# Patient Record
Sex: Female | Born: 1985 | Race: Black or African American | Hispanic: No | Marital: Married | Smoking: Never smoker
Health system: Southern US, Community
[De-identification: ages and names within clinical notes are randomized; demographics above are authoritative.]

## PROBLEM LIST (undated history)

## (undated) DIAGNOSIS — I10 Essential (primary) hypertension: Secondary | ICD-10-CM

## (undated) DIAGNOSIS — N938 Other specified abnormal uterine and vaginal bleeding: Principal | ICD-10-CM

## (undated) DIAGNOSIS — E282 Polycystic ovarian syndrome: Secondary | ICD-10-CM

## (undated) DIAGNOSIS — A6000 Herpesviral infection of urogenital system, unspecified: Secondary | ICD-10-CM

## (undated) DIAGNOSIS — A549 Gonococcal infection, unspecified: Secondary | ICD-10-CM

## (undated) HISTORY — DX: Morbid (severe) obesity due to excess calories: E66.01

## (undated) HISTORY — DX: Other specified abnormal uterine and vaginal bleeding: N93.8

---

## 2009-10-10 DIAGNOSIS — N938 Other specified abnormal uterine and vaginal bleeding: Secondary | ICD-10-CM

## 2009-10-10 DIAGNOSIS — E282 Polycystic ovarian syndrome: Secondary | ICD-10-CM

## 2009-10-10 HISTORY — DX: Polycystic ovarian syndrome: E28.2

## 2009-10-10 HISTORY — DX: Other specified abnormal uterine and vaginal bleeding: N93.8

## 2009-12-15 ENCOUNTER — Emergency Department (HOSPITAL_COMMUNITY): Admission: EM | Admit: 2009-12-15 | Discharge: 2009-12-15 | Payer: Self-pay | Admitting: Emergency Medicine

## 2010-03-09 ENCOUNTER — Emergency Department (HOSPITAL_COMMUNITY): Admission: EM | Admit: 2010-03-09 | Discharge: 2010-03-09 | Payer: Self-pay | Admitting: Emergency Medicine

## 2010-06-02 ENCOUNTER — Emergency Department (HOSPITAL_COMMUNITY): Admission: EM | Admit: 2010-06-02 | Discharge: 2010-06-02 | Payer: Self-pay | Admitting: Emergency Medicine

## 2010-08-21 ENCOUNTER — Emergency Department (HOSPITAL_COMMUNITY): Admission: EM | Admit: 2010-08-21 | Discharge: 2010-08-22 | Payer: Self-pay | Admitting: Emergency Medicine

## 2010-09-14 ENCOUNTER — Emergency Department (HOSPITAL_COMMUNITY)
Admission: EM | Admit: 2010-09-14 | Discharge: 2010-09-14 | Payer: Self-pay | Source: Home / Self Care | Admitting: Emergency Medicine

## 2010-09-27 ENCOUNTER — Emergency Department (HOSPITAL_COMMUNITY)
Admission: EM | Admit: 2010-09-27 | Discharge: 2010-09-27 | Payer: Self-pay | Source: Home / Self Care | Admitting: Emergency Medicine

## 2010-12-01 ENCOUNTER — Ambulatory Visit: Payer: Self-pay | Admitting: Family Medicine

## 2010-12-12 ENCOUNTER — Emergency Department (HOSPITAL_COMMUNITY)
Admission: EM | Admit: 2010-12-12 | Discharge: 2010-12-12 | Disposition: A | Discharge status: Home / Self Care | Payer: Self-pay | Attending: Emergency Medicine | Admitting: Emergency Medicine

## 2010-12-12 DIAGNOSIS — S335XXA Sprain of ligaments of lumbar spine, initial encounter: Secondary | ICD-10-CM | POA: Insufficient documentation

## 2010-12-12 DIAGNOSIS — R51 Headache: Secondary | ICD-10-CM | POA: Insufficient documentation

## 2010-12-12 DIAGNOSIS — M545 Low back pain, unspecified: Secondary | ICD-10-CM | POA: Insufficient documentation

## 2010-12-12 DIAGNOSIS — X58XXXA Exposure to other specified factors, initial encounter: Secondary | ICD-10-CM | POA: Insufficient documentation

## 2010-12-12 DIAGNOSIS — J45909 Unspecified asthma, uncomplicated: Secondary | ICD-10-CM | POA: Insufficient documentation

## 2010-12-21 LAB — CBC
HCT: 34.9 % — ABNORMAL LOW (ref 36.0–46.0)
Hemoglobin: 10.8 g/dL — ABNORMAL LOW (ref 12.0–15.0)
MCH: 22.2 pg — ABNORMAL LOW (ref 26.0–34.0)
MCH: 22 pg — ABNORMAL LOW (ref 26.0–34.0)
MCHC: 31.1 g/dL (ref 30.0–36.0)
Platelets: 337 10*3/uL (ref 150–400)
RBC: 4.91 MIL/uL (ref 3.87–5.11)
RDW: 16.9 % — ABNORMAL HIGH (ref 11.5–15.5)

## 2010-12-21 LAB — GC/CHLAMYDIA PROBE AMP, GENITAL
Chlamydia, DNA Probe: NEGATIVE
Chlamydia, DNA Probe: NEGATIVE
GC Probe Amp, Genital: NEGATIVE

## 2010-12-21 LAB — WET PREP, GENITAL
Clue Cells Wet Prep HPF POC: NONE SEEN
Trich, Wet Prep: NONE SEEN
Trich, Wet Prep: NONE SEEN
WBC, Wet Prep HPF POC: NONE SEEN

## 2010-12-21 LAB — HEPATIC FUNCTION PANEL
ALT: 17 U/L (ref 0–35)
AST: 21 U/L (ref 0–37)
Albumin: 3.9 g/dL (ref 3.5–5.2)
Bilirubin, Direct: 0.2 mg/dL (ref 0.0–0.3)
Total Bilirubin: 0.9 mg/dL (ref 0.3–1.2)

## 2010-12-21 LAB — POCT I-STAT, CHEM 8
BUN: 12 mg/dL (ref 6–23)
Calcium, Ion: 1.15 mmol/L (ref 1.12–1.32)
Chloride: 107 meq/L (ref 96–112)
Creatinine, Ser: 0.8 mg/dL (ref 0.4–1.2)
Glucose, Bld: 83 mg/dL (ref 70–99)
HCT: 37 % (ref 36.0–46.0)
Hemoglobin: 12.6 g/dL (ref 12.0–15.0)
Potassium: 4.2 meq/L (ref 3.5–5.1)
Sodium: 142 meq/L (ref 135–145)
TCO2: 26 mmol/L (ref 0–100)

## 2010-12-21 LAB — URINALYSIS, ROUTINE W REFLEX MICROSCOPIC
Glucose, UA: NEGATIVE mg/dL
Hgb urine dipstick: NEGATIVE
Hgb urine dipstick: NEGATIVE
Ketones, ur: 15 mg/dL — AB
Nitrite: NEGATIVE
Specific Gravity, Urine: 1.027 (ref 1.005–1.030)
Urobilinogen, UA: 1 mg/dL (ref 0.0–1.0)
pH: 6 (ref 5.0–8.0)

## 2010-12-21 LAB — DIFFERENTIAL
Basophils Relative: 0 % (ref 0–1)
Eosinophils Relative: 1 % (ref 0–5)
Lymphocytes Relative: 45 % (ref 12–46)
Neutrophils Relative %: 46 % (ref 43–77)

## 2010-12-21 LAB — BASIC METABOLIC PANEL
CO2: 26 mEq/L (ref 19–32)
Calcium: 8.9 mg/dL (ref 8.4–10.5)
GFR calc Af Amer: >60 mL/min (ref >60)
Potassium: 3.8 mEq/L (ref 3.5–5.1)
Sodium: 139 mEq/L (ref 135–145)

## 2010-12-21 LAB — POCT PREGNANCY, URINE: Preg Test, Ur: NEGATIVE

## 2010-12-24 LAB — RAPID STREP SCREEN (MED CTR MEBANE ONLY): Streptococcus, Group A Screen (Direct): NEGATIVE

## 2010-12-27 LAB — POCT PREGNANCY, URINE: Preg Test, Ur: NEGATIVE

## 2011-01-03 LAB — DIFFERENTIAL
Basophils Absolute: 0.1 10*3/uL (ref 0.0–0.1)
Basophils Relative: 2 % — ABNORMAL HIGH (ref 0–1)
Lymphocytes Relative: 30 % (ref 12–46)
Monocytes Absolute: 0.5 10*3/uL (ref 0.1–1.0)
Neutro Abs: 4.3 10*3/uL (ref 1.7–7.7)
Neutrophils Relative %: 60 % (ref 43–77)

## 2011-01-03 LAB — WET PREP, GENITAL
Trich, Wet Prep: NONE SEEN
Yeast Wet Prep HPF POC: NONE SEEN

## 2011-01-03 LAB — POCT I-STAT, CHEM 8
BUN: 13 mg/dL (ref 6–23)
Calcium, Ion: 1.16 mmol/L (ref 1.12–1.32)
Hemoglobin: 10.5 g/dL — ABNORMAL LOW (ref 12.0–15.0)
Sodium: 141 mEq/L (ref 135–145)
TCO2: 27 mmol/L (ref 0–100)

## 2011-01-03 LAB — URINALYSIS, ROUTINE W REFLEX MICROSCOPIC
Bilirubin Urine: NEGATIVE
Nitrite: NEGATIVE
Protein, ur: NEGATIVE mg/dL
Specific Gravity, Urine: 1.008 (ref 1.005–1.030)
Urobilinogen, UA: 0.2 mg/dL (ref 0.0–1.0)

## 2011-01-03 LAB — URINE MICROSCOPIC-ADD ON

## 2011-01-03 LAB — CBC
Hemoglobin: 9.8 g/dL — ABNORMAL LOW (ref 12.0–15.0)
MCHC: 33 g/dL (ref 30.0–36.0)
Platelets: 315 10*3/uL (ref 150–400)
RDW: 16.5 % — ABNORMAL HIGH (ref 11.5–15.5)

## 2011-01-03 LAB — POCT PREGNANCY, URINE: Preg Test, Ur: NEGATIVE

## 2011-01-03 LAB — GC/CHLAMYDIA PROBE AMP, GENITAL
Chlamydia, DNA Probe: NEGATIVE
GC Probe Amp, Genital: NEGATIVE

## 2011-01-03 LAB — RPR: RPR Ser Ql: NONREACTIVE

## 2011-01-19 ENCOUNTER — Emergency Department (HOSPITAL_COMMUNITY): Payer: Self-pay

## 2011-01-19 ENCOUNTER — Emergency Department (HOSPITAL_COMMUNITY)
Admission: EM | Admit: 2011-01-19 | Discharge: 2011-01-19 | Disposition: A | Discharge status: Home / Self Care | Payer: Self-pay | Attending: Emergency Medicine | Admitting: Emergency Medicine

## 2011-01-19 DIAGNOSIS — N898 Other specified noninflammatory disorders of vagina: Secondary | ICD-10-CM | POA: Insufficient documentation

## 2011-01-19 DIAGNOSIS — N83209 Unspecified ovarian cyst, unspecified side: Secondary | ICD-10-CM | POA: Insufficient documentation

## 2011-01-19 DIAGNOSIS — R10813 Right lower quadrant abdominal tenderness: Secondary | ICD-10-CM | POA: Insufficient documentation

## 2011-01-19 DIAGNOSIS — R109 Unspecified abdominal pain: Secondary | ICD-10-CM | POA: Insufficient documentation

## 2011-01-19 LAB — BASIC METABOLIC PANEL
BUN: 14 mg/dL (ref 6–23)
Calcium: 8.8 mg/dL (ref 8.4–10.5)
GFR calc non Af Amer: >60 mL/min (ref >60)
Glucose, Bld: 91 mg/dL (ref 70–99)

## 2011-01-19 LAB — URINALYSIS, ROUTINE W REFLEX MICROSCOPIC
Bilirubin Urine: NEGATIVE
Glucose, UA: NEGATIVE mg/dL
Specific Gravity, Urine: 1.018 (ref 1.005–1.030)
pH: 6 (ref 5.0–8.0)

## 2011-01-19 LAB — CBC
MCH: 21.9 pg — ABNORMAL LOW (ref 26.0–34.0)
MCV: 69.3 fL — ABNORMAL LOW (ref 78.0–100.0)
Platelets: 301 10*3/uL (ref 150–400)
RDW: 16.8 % — ABNORMAL HIGH (ref 11.5–15.5)
WBC: 4.9 10*3/uL (ref 4.0–10.5)

## 2011-01-19 LAB — DIFFERENTIAL
Basophils Absolute: 0 10*3/uL (ref 0.0–0.1)
Basophils Relative: 0 % (ref 0–1)
Eosinophils Absolute: 0.1 10*3/uL (ref 0.0–0.7)
Lymphocytes Relative: 40 % (ref 12–46)
Monocytes Absolute: 0.6 10*3/uL (ref 0.1–1.0)
Neutrophils Relative %: 44 % (ref 43–77)

## 2011-01-19 LAB — POCT PREGNANCY, URINE: Preg Test, Ur: NEGATIVE

## 2011-01-19 LAB — URINE MICROSCOPIC-ADD ON

## 2011-01-19 LAB — WET PREP, GENITAL: Yeast Wet Prep HPF POC: NONE SEEN

## 2011-01-19 MED ORDER — IOHEXOL 300 MG/ML  SOLN
100.0000 mL | Freq: Once | INTRAMUSCULAR | Status: AC | PRN
  Administered 2011-01-19: 100 mL via INTRAVENOUS

## 2011-01-20 ENCOUNTER — Ambulatory Visit (INDEPENDENT_AMBULATORY_CARE_PROVIDER_SITE_OTHER): Payer: Self-pay | Admitting: Family

## 2011-01-20 ENCOUNTER — Other Ambulatory Visit: Payer: Self-pay | Admitting: Family

## 2011-01-20 DIAGNOSIS — E282 Polycystic ovarian syndrome: Secondary | ICD-10-CM

## 2011-01-20 LAB — GC/CHLAMYDIA PROBE AMP, GENITAL
Chlamydia, DNA Probe: NEGATIVE
GC Probe Amp, Genital: NEGATIVE

## 2011-01-21 NOTE — Assessment & Plan Note (Signed)
[name], [name]            ACCOUNT NO.:  [id number]  MEDICAL RECORD NO.:  [id number]           PATIENT TYPE:  A  LOCATION:  [facility] at [facility]         FACILITY:  [facility]  PHYSICIAN:  Sid Falcon, CNM  DATE OF BIRTH:  04/10/1986  DATE OF SERVICE:  01/20/2011                                 CLINIC NOTE  REASON FOR VISIT:  Evaluation for irregular menstrual cycle.  HISTORY OF PRESENT ILLNESS:  The patient is here with report of having her cycles every 2-3 months, and recently seen at [facility] with a completed CT scan of the abdomen and pelvis due to abdominal pain.  The results show small ovarian follicles noticed bilaterally with moderate amount of free fluid in the pelvis.  All other things that are in the ultrasound was normal.  MENSTRUAL CYCLE HISTORY:  Last menstrual period, September 16, 2010, lasted approximately 7 days.  CONTRACEPTIVE HISTORY:  Has never used any form of birth control.  OBSTETRICAL HISTORY:  Last pregnancy was in 2010 with a miscarriage at 8 weeks.  Last Pap smear, 2010.  No history of abnormal Pap.  MEDICAL HISTORY:  Asthma and high blood pressure.  SOCIAL HISTORY:  Lives with boyfriend.  No history of smoking, alcohol, illicit drug use.  No history of sexual or physical abuse.  FAMILY HISTORY:  Father with diabetes and high blood pressure.  Cervical cancer in her mother.  REVIEW OF SYSTEMS:  The patient reports having intermittent weakness with occasional headaches, occurring 2 weeks ago.  Has some nausea and pain that was noted on April 5 which she was seen at [facility].  PHYSICAL EXAMINATION:  VITAL SIGNS:  The patient's temperature is 98.3, pulse 74, blood pressure 139/93, weight 331 pounds, height 5 feet 8 inches. GENERAL:  The patient is alert and oriented x3.  Torso, no linear hair noted. NECK:  No thyromegaly.  No dominant masses, nontender with palpation. Darkening at the nape of the neck. CHEST:  Clear to  auscultation bilaterally. CARDIOVASCULAR SYSTEM:  Regular rate and rhythm without murmurs, gallops, or rubs. ABDOMEN:  Morbidly obese. PELVIC:  No abnormal lesions noted.  Cervix found without difficulty. Pap smear obtained.  Negative cervical motion tenderness.  ASSESSMENT: 1. Polycystic ovarian syndrome. 2. Hypertension.  PLAN:  The patient is to follow up with her PCP for management of her Hypertension.  Labs:  hemoglobin A1c, total testosterone, FSH, LH, TSH, prolactin.  Completed pregnancy test last night, negative.  Prescription given forProvera.  The patient is to follow up in 2 weeks for results.     Sid Falcon, CNM    WM/MEDQ  D:  01/20/2011  T:  01/21/2011  Job:  [id number]

## 2011-01-24 ENCOUNTER — Emergency Department (HOSPITAL_COMMUNITY): Payer: Self-pay

## 2011-01-24 ENCOUNTER — Emergency Department (HOSPITAL_COMMUNITY)
Admission: EM | Admit: 2011-01-24 | Discharge: 2011-01-24 | Disposition: A | Discharge status: Home / Self Care | Payer: Self-pay | Attending: Emergency Medicine | Admitting: Emergency Medicine

## 2011-01-24 DIAGNOSIS — J45909 Unspecified asthma, uncomplicated: Secondary | ICD-10-CM | POA: Insufficient documentation

## 2011-01-24 DIAGNOSIS — R109 Unspecified abdominal pain: Secondary | ICD-10-CM | POA: Insufficient documentation

## 2011-01-24 DIAGNOSIS — R51 Headache: Secondary | ICD-10-CM | POA: Insufficient documentation

## 2011-01-26 ENCOUNTER — Ambulatory Visit: Payer: Self-pay | Admitting: Family Medicine

## 2011-02-03 ENCOUNTER — Ambulatory Visit: Payer: Self-pay | Admitting: Family

## 2011-07-03 ENCOUNTER — Encounter (HOSPITAL_COMMUNITY): Payer: Self-pay | Admitting: *Deleted

## 2011-07-03 ENCOUNTER — Inpatient Hospital Stay (HOSPITAL_COMMUNITY)
Admission: AD | Admit: 2011-07-03 | Discharge: 2011-07-03 | Disposition: A | Discharge status: Home / Self Care | Payer: Self-pay | Source: Ambulatory Visit | Attending: Obstetrics & Gynecology | Admitting: Obstetrics & Gynecology

## 2011-07-03 DIAGNOSIS — N939 Abnormal uterine and vaginal bleeding, unspecified: Secondary | ICD-10-CM

## 2011-07-03 DIAGNOSIS — N938 Other specified abnormal uterine and vaginal bleeding: Secondary | ICD-10-CM | POA: Insufficient documentation

## 2011-07-03 DIAGNOSIS — D649 Anemia, unspecified: Secondary | ICD-10-CM | POA: Insufficient documentation

## 2011-07-03 DIAGNOSIS — N926 Irregular menstruation, unspecified: Secondary | ICD-10-CM

## 2011-07-03 DIAGNOSIS — N949 Unspecified condition associated with female genital organs and menstrual cycle: Secondary | ICD-10-CM | POA: Insufficient documentation

## 2011-07-03 HISTORY — DX: Essential (primary) hypertension: I10

## 2011-07-03 HISTORY — DX: Polycystic ovarian syndrome: E28.2

## 2011-07-03 LAB — WET PREP, GENITAL
Clue Cells Wet Prep HPF POC: NONE SEEN
Trich, Wet Prep: NONE SEEN
Yeast Wet Prep HPF POC: NONE SEEN

## 2011-07-03 LAB — CBC
HCT: 29.7 % — ABNORMAL LOW (ref 36.0–46.0)
Hemoglobin: 9.5 g/dL — ABNORMAL LOW (ref 12.0–15.0)
MCH: 22.4 pg — ABNORMAL LOW (ref 26.0–34.0)
MCHC: 32 g/dL (ref 30.0–36.0)
MCV: 69.9 fL — ABNORMAL LOW (ref 78.0–100.0)
Platelets: 279 10*3/uL (ref 150–400)
RBC: 4.25 MIL/uL (ref 3.87–5.11)
RDW: 17.8 % — ABNORMAL HIGH (ref 11.5–15.5)
WBC: 6.3 10*3/uL (ref 4.0–10.5)

## 2011-07-03 LAB — URINALYSIS, ROUTINE W REFLEX MICROSCOPIC
Bilirubin Urine: NEGATIVE
Glucose, UA: NEGATIVE mg/dL
Hgb urine dipstick: NEGATIVE
Ketones, ur: NEGATIVE mg/dL
Leukocytes, UA: NEGATIVE
Nitrite: NEGATIVE
Protein, ur: NEGATIVE mg/dL
Specific Gravity, Urine: >1.03 — ABNORMAL HIGH (ref 1.005–1.030)
Urobilinogen, UA: 1 mg/dL (ref 0.0–1.0)
pH: 6 (ref 5.0–8.0)

## 2011-07-03 MED ORDER — NORGESTIMATE-ETH ESTRADIOL 0.25-35 MG-MCG PO TABS: 1.0000 | ORAL_TABLET | Freq: Two times a day (BID) | ORAL | Status: DC

## 2011-07-03 MED ORDER — NAPROXEN SODIUM 550 MG PO TABS: 550.0000 mg | ORAL_TABLET | Freq: Two times a day (BID) | ORAL | Status: DC

## 2011-07-03 NOTE — Progress Notes (Addendum)
Pt reports she has been having lower abd pain offa nd on x3 weeks with vaginal bleeding. Pain got worse today at work. Pt reports having a normal period on 05/17/2011. Then started her period on 9/9 and has continued to bleed since.

## 2011-07-03 NOTE — ED Provider Notes (Signed)
History     CSN: [id number] Arrival date & time: 07/03/2011 10:50 AM  Chief Complaint  Patient presents with  . Abdominal Pain  . Vaginal Bleeding    HPI  (Consider location/radiation/quality/duration/timing/severity/associated sxs/prior treatment)  HPITiffany House is a 25 y.o. G2 P0020 who arrives via EMS for c/o bleeding and cramping. Known PCOS.  Pt LMP was 9/7, she hasn't stopped bleeding since. Blood is dark red, changes a pad 8x/day, grape sized clots and bil lower abd cramping. She had yellow mucoid vaginal discharge 2 days ago, no UTI S&S or GI changes. She took 800 mg of OTC Ibuprofen at 7 am, some relief. Nothing for [facility], trying to get pregnant.  Past Medical History  Diagnosis Date  . Asthma   . Hypertension   . Polycystic ovarian syndrome     Past Surgical History  Procedure Date  . No past surgeries     No family history on file.  History  Substance Use Topics  . Smoking status: Never Smoker   . Smokeless tobacco: Not on file  . Alcohol Use: No    OB History    Grav Para Term Preterm Abortions TAB SAB Ect Mult Living   2 0 0 0 2 0 2 0 0 0       Review of Systems  Review of Systems  Constitutional: Negative.   Gastrointestinal: Negative.   Genitourinary: Positive for vaginal bleeding and vaginal discharge.       Cramping  Neurological: Negative for dizziness.    Allergies  Latex  Home Medications  No current outpatient prescriptions on file.  Physical Exam    BP 139/51  Pulse 68  Temp(Src) 98.1 F (36.7 C) (Oral)  Resp 18  Ht 5\' 7"  (1.702 m)  Wt 326 lb (147.873 kg)  BMI 51.06 kg/m2  LMP 06/19/2011  Physical Exam  Constitutional: She is oriented to person, place, and time.       Obese AA female, in no apparent distress.  Abdominal: Soft. She exhibits no mass. There is no rebound and no guarding.       Mild bil lower abd tenderness  Genitourinary:       Ext gen- Nl for age Vagina- sm amt mucoid bright red blood Cx  closed Uterus-no palp enlargement, exam compromised due to size Adn-no masses palp   Musculoskeletal: Normal range of motion.  Neurological: She is alert and oriented to person, place, and time.  Skin: Skin is warm and dry.  Psychiatric: She has a normal mood and affect. Her behavior is normal.    ED Course  Procedures (including critical care time)  Labs Reviewed  URINALYSIS, ROUTINE W REFLEX MICROSCOPIC - Abnormal; Notable for the following:    Specific Gravity, Urine >1.030 (*)    All other components within normal limits  POCT PREGNANCY, URINE  POCT PREGNANCY, URINE  CBC  WET PREP, GENITAL  GC/CHLAMYDIA PROBE AMP, GENITAL   No results found. Hgb 9.5   No diagnosis found.   MDM Assessemnt-  Abnormal uterine bleeding, anemia Plan Iron daily OC to control bleeding. Pt has never had PCOS evaluated, was dx on U/S in ED. Refer to GYN clinic for care. Anaprox DS for cramping.         Avon Gully. Archie Atilano 07/03/11 1338

## 2011-07-04 LAB — GC/CHLAMYDIA PROBE AMP, GENITAL
Chlamydia, DNA Probe: NEGATIVE
GC Probe Amp, Genital: NEGATIVE

## 2011-07-13 ENCOUNTER — Encounter: Payer: Self-pay | Admitting: *Deleted

## 2011-07-13 ENCOUNTER — Other Ambulatory Visit: Payer: Self-pay | Admitting: Obstetrics and Gynecology

## 2011-07-13 ENCOUNTER — Emergency Department (HOSPITAL_COMMUNITY)
Admission: EM | Admit: 2011-07-13 | Discharge: 2011-07-13 | Disposition: A | Discharge status: Home / Self Care | Payer: Self-pay | Attending: Emergency Medicine | Admitting: Emergency Medicine

## 2011-07-13 ENCOUNTER — Ambulatory Visit (INDEPENDENT_AMBULATORY_CARE_PROVIDER_SITE_OTHER): Payer: Self-pay | Admitting: Obstetrics and Gynecology

## 2011-07-13 VITALS — BP 146/81 | HR 79 | Temp 98.9°F | Ht 66.0 in | Wt 346.4 lb

## 2011-07-13 DIAGNOSIS — E669 Obesity, unspecified: Secondary | ICD-10-CM

## 2011-07-13 DIAGNOSIS — N898 Other specified noninflammatory disorders of vagina: Secondary | ICD-10-CM | POA: Insufficient documentation

## 2011-07-13 DIAGNOSIS — N938 Other specified abnormal uterine and vaginal bleeding: Secondary | ICD-10-CM

## 2011-07-13 DIAGNOSIS — R109 Unspecified abdominal pain: Secondary | ICD-10-CM | POA: Insufficient documentation

## 2011-07-13 DIAGNOSIS — N949 Unspecified condition associated with female genital organs and menstrual cycle: Secondary | ICD-10-CM

## 2011-07-13 DIAGNOSIS — D649 Anemia, unspecified: Secondary | ICD-10-CM | POA: Insufficient documentation

## 2011-07-13 LAB — DIFFERENTIAL
Basophils Absolute: 0 10*3/uL (ref 0.0–0.1)
Basophils Relative: 0 % (ref 0–1)
Lymphocytes Relative: 34 % (ref 12–46)
Monocytes Absolute: 0.6 10*3/uL (ref 0.1–1.0)
Neutro Abs: 3.7 10*3/uL (ref 1.7–7.7)
Neutrophils Relative %: 54 % (ref 43–77)

## 2011-07-13 LAB — CBC
HCT: 29.6 % — ABNORMAL LOW (ref 36.0–46.0)
Hemoglobin: 9.2 g/dL — ABNORMAL LOW (ref 12.0–15.0)
MCHC: 31.1 g/dL (ref 30.0–36.0)
RBC: 4.15 MIL/uL (ref 3.87–5.11)
WBC: 6.8 10*3/uL (ref 4.0–10.5)

## 2011-07-13 LAB — POCT PREGNANCY, URINE: Preg Test, Ur: NEGATIVE

## 2011-07-13 MED ORDER — MEDROXYPROGESTERONE ACETATE 10 MG PO TABS: 10.0000 mg | ORAL_TABLET | Freq: Every day | ORAL | Status: DC

## 2011-07-13 NOTE — Progress Notes (Signed)
Offered Flu vaccine- given information.  Pt will decide at a later date.

## 2011-07-13 NOTE — Progress Notes (Signed)
This patient is a 25 year old married gravida 1 para 0010. Was always had a history of irregular periods lasting about 7 days. During this episode she has been bleeding almost continually since the early part of September. She went into [place]: Emergency room and was placed on oral contraceptives which did nothing to stop the bleeding. The patient is morbidly obese but no hirsutism. She's been told she has polycystic ovarian syndrome but so far early criteria for meeting is her irregular periods. She had a normal Pap smear in April of this year.  She previously had FSH LH free testosterone levels done all of which were normal.  Plan: Obtain an ultrasound because as well as the irregular periods she does have dysmenorrhea. This will evaluating the ovaries as well as the endometrial stripe. So we are going to start her on oral Provera 10 mg twice a day for 10 days and if this controls the bleeding we'll continue this daily for the first 10 days of the month for 3 months. As the patient is trying to get pregnant we will have her return in 3 months and perhaps start on Clomid at that time.  Impression: Dysfunctional uterine bleeding possible hypothalamic, possible PC OS.

## 2011-07-15 ENCOUNTER — Encounter (HOSPITAL_COMMUNITY): Payer: Self-pay | Admitting: *Deleted

## 2011-07-25 ENCOUNTER — Telehealth: Payer: Self-pay | Admitting: *Deleted

## 2011-07-25 NOTE — Telephone Encounter (Signed)
Pt. Called this am at 7:47 and left a message she was seen 07/13/11 by a gynecologist and he gave her a Birth control pill called provera that was supposed to help stop her bleeding- he said for me to call back in 10 days and it is now the 5th Monday and I am still bleeding. He said he wanted to do an Ultrasound and " i'm guessing I need an appointment" . Can you call me back?

## 2011-07-25 NOTE — Telephone Encounter (Signed)
Called patient and left a message we are returning her call.  From chart review it appears pt. Had [place] ordered written at last visit 07/13/11, but pt. Did not schedule a appointment with check out.  Needs appt scheduled.

## 2011-07-25 NOTE — Telephone Encounter (Signed)
Pt. Does have appt for [place] for 07/26/11 , but may not realize it.

## 2011-07-25 NOTE — Telephone Encounter (Signed)
Called pt again and spoke with patient.   Informed her she already has a ultrasound scheduled, gave patient that appointment and instructions .  Also informed pt. She has follow up appointment scheduled for 10/26 - pt. Was aware of that appointment.  Also explained to pt. That provera is not a birth control pill, but is a hormone pill to help with her bleeding. Pt. States still bleeding heavy-sometimes changes pad every 2 hours.. Instructed pt to continue provera as instructed and keep appts.  Also to report to [facility] if bleeding heavy enough that pad or tampon is saturated in one hour or less or feels dizzy or weak.  Pt. Voices understanding.

## 2011-07-26 ENCOUNTER — Ambulatory Visit (HOSPITAL_COMMUNITY)
Admission: RE | Admit: 2011-07-26 | Discharge: 2011-07-26 | Disposition: A | Discharge status: Home / Self Care | Payer: Self-pay | Source: Ambulatory Visit | Attending: Obstetrics and Gynecology | Admitting: Obstetrics and Gynecology

## 2011-07-26 DIAGNOSIS — N949 Unspecified condition associated with female genital organs and menstrual cycle: Secondary | ICD-10-CM | POA: Insufficient documentation

## 2011-07-26 DIAGNOSIS — N938 Other specified abnormal uterine and vaginal bleeding: Secondary | ICD-10-CM

## 2011-08-05 ENCOUNTER — Encounter: Payer: Self-pay | Admitting: Obstetrics and Gynecology

## 2011-08-05 ENCOUNTER — Ambulatory Visit (INDEPENDENT_AMBULATORY_CARE_PROVIDER_SITE_OTHER): Payer: Self-pay | Admitting: Obstetrics and Gynecology

## 2011-08-05 VITALS — BP 142/89 | HR 87 | Temp 99.0°F | Ht 67.0 in | Wt 344.2 lb

## 2011-08-05 DIAGNOSIS — N938 Other specified abnormal uterine and vaginal bleeding: Secondary | ICD-10-CM

## 2011-08-05 DIAGNOSIS — N949 Unspecified condition associated with female genital organs and menstrual cycle: Secondary | ICD-10-CM

## 2011-08-05 MED ORDER — MEDROXYPROGESTERONE ACETATE 150 MG/ML IM SUSP
150.0000 mg | Freq: Once | INTRAMUSCULAR | Status: AC
  Administered 2011-08-05: 150 mg via INTRAMUSCULAR

## 2011-08-05 NOTE — Progress Notes (Signed)
Patient is a 25 year old African American female morbidly obese with polycystic ovarian syndrome who was last seen earlier in October because she been bleeding for one month. We placed her on oral Provera twice a day which failed to stop her bleeding. She stopped this still been bleeding and passing clots. Her previous hemoglobin was 10 g. Her ultrasound was normal endometrium was normal. She previously been on oral contraceptives with the same result bleeding did not stop. I'm going to try her on Depo-Provera 150 mg if that doesn't work the patient will need a D&C. Also we'll have her return in 2 months if it does work to be a little plan for the future. He is she wants to conceive now or perhaps use a [place] IUD.

## 2011-08-05 NOTE — Progress Notes (Signed)
Addended by: Lynnell Dike on: 08/05/2011 11:37 AM   Modules accepted: Orders

## 2011-08-06 LAB — CBC
HCT: 34.2 % — ABNORMAL LOW (ref 36.0–46.0)
MCV: 71.5 fL — ABNORMAL LOW (ref 78.0–100.0)
RBC: 4.78 MIL/uL (ref 3.87–5.11)
WBC: 5.8 10*3/uL (ref 4.0–10.5)

## 2011-08-09 ENCOUNTER — Encounter: Payer: Self-pay | Admitting: Obstetrics and Gynecology

## 2011-08-11 ENCOUNTER — Ambulatory Visit (INDEPENDENT_AMBULATORY_CARE_PROVIDER_SITE_OTHER): Payer: Self-pay | Admitting: Obstetrics & Gynecology

## 2011-08-11 ENCOUNTER — Encounter: Payer: Self-pay | Admitting: Obstetrics & Gynecology

## 2011-08-11 VITALS — BP 153/96 | HR 89 | Temp 99.1°F | Ht 67.0 in | Wt 347.4 lb

## 2011-08-11 DIAGNOSIS — N949 Unspecified condition associated with female genital organs and menstrual cycle: Secondary | ICD-10-CM

## 2011-08-11 DIAGNOSIS — N938 Other specified abnormal uterine and vaginal bleeding: Secondary | ICD-10-CM

## 2011-08-11 NOTE — Progress Notes (Signed)
Pt signed consent for flu vaccine.

## 2011-08-11 NOTE — Progress Notes (Signed)
  Subjective:    Patient ID: [name], female    DOB: 1986/10/01, 25 y.o.   MRN: [id number]  HPI  25 yo married AA G1P0A1 who has a long history of DUB, PCOS, and morbid obesity.  She has become anemic because of the DUB.  Most recently after receiving a depo provera shot, her vaginal bleeding has subsided, but she is experiencing cramping now.  Review of Systems    She would like a flu vaccine today.  Her TSH was normal 10-12. Objective:   Physical Exam        Assessment & Plan:  DUB with anemia- I will do a d&c at her convenience.

## 2011-08-13 ENCOUNTER — Encounter (HOSPITAL_COMMUNITY): Payer: Self-pay

## 2011-08-15 ENCOUNTER — Encounter (HOSPITAL_COMMUNITY): Payer: Self-pay | Admitting: Anesthesiology

## 2011-08-15 ENCOUNTER — Ambulatory Visit (HOSPITAL_COMMUNITY): Payer: Self-pay | Admitting: Anesthesiology

## 2011-08-15 ENCOUNTER — Other Ambulatory Visit: Payer: Self-pay | Admitting: Obstetrics & Gynecology

## 2011-08-15 ENCOUNTER — Encounter (HOSPITAL_COMMUNITY)
Admission: RE | Disposition: A | Discharge status: Home / Self Care | Payer: Self-pay | Source: Ambulatory Visit | Attending: Obstetrics & Gynecology

## 2011-08-15 ENCOUNTER — Ambulatory Visit (HOSPITAL_COMMUNITY)
Admission: RE | Admit: 2011-08-15 | Discharge: 2011-08-15 | Disposition: A | Discharge status: Home / Self Care | Payer: Self-pay | Source: Ambulatory Visit | Attending: Obstetrics & Gynecology | Admitting: Obstetrics & Gynecology

## 2011-08-15 ENCOUNTER — Encounter (HOSPITAL_COMMUNITY): Payer: Self-pay | Admitting: *Deleted

## 2011-08-15 DIAGNOSIS — N949 Unspecified condition associated with female genital organs and menstrual cycle: Secondary | ICD-10-CM

## 2011-08-15 DIAGNOSIS — D649 Anemia, unspecified: Secondary | ICD-10-CM | POA: Insufficient documentation

## 2011-08-15 DIAGNOSIS — D509 Iron deficiency anemia, unspecified: Secondary | ICD-10-CM

## 2011-08-15 DIAGNOSIS — E282 Polycystic ovarian syndrome: Secondary | ICD-10-CM

## 2011-08-15 DIAGNOSIS — N938 Other specified abnormal uterine and vaginal bleeding: Secondary | ICD-10-CM | POA: Insufficient documentation

## 2011-08-15 HISTORY — PX: DILATION AND CURETTAGE OF UTERUS: SHX78

## 2011-08-15 LAB — CBC
HCT: 30.9 % — ABNORMAL LOW (ref 36.0–46.0)
MCH: 21.4 pg — ABNORMAL LOW (ref 26.0–34.0)
MCV: 70.4 fL — ABNORMAL LOW (ref 78.0–100.0)
Platelets: 328 10*3/uL (ref 150–400)
RBC: 4.39 MIL/uL (ref 3.87–5.11)
WBC: 8.1 10*3/uL (ref 4.0–10.5)

## 2011-08-15 SURGERY — DILATION AND CURETTAGE
Anesthesia: Choice | Site: Uterus | Wound class: Clean Contaminated

## 2011-08-15 MED ORDER — ONDANSETRON HCL 4 MG/2ML IJ SOLN
INTRAMUSCULAR | Status: DC | PRN
  Administered 2011-08-15: 4 mg via INTRAVENOUS

## 2011-08-15 MED ORDER — DEXTROSE IN LACTATED RINGERS 5 % IV SOLN: INTRAVENOUS | Status: DC

## 2011-08-15 MED ORDER — KETOROLAC TROMETHAMINE 30 MG/ML IJ SOLN
INTRAMUSCULAR | Status: AC
  Filled 2011-08-15: qty 1

## 2011-08-15 MED ORDER — MIDAZOLAM HCL 5 MG/5ML IJ SOLN
INTRAMUSCULAR | Status: DC | PRN
  Administered 2011-08-15: 2 mg via INTRAVENOUS

## 2011-08-15 MED ORDER — PROPOFOL 10 MG/ML IV EMUL
INTRAVENOUS | Status: DC | PRN
  Administered 2011-08-15: 200 mg via INTRAVENOUS

## 2011-08-15 MED ORDER — LIDOCAINE HCL (CARDIAC) 20 MG/ML IV SOLN
INTRAVENOUS | Status: AC
  Filled 2011-08-15: qty 5

## 2011-08-15 MED ORDER — LIDOCAINE HCL (CARDIAC) 20 MG/ML IV SOLN
INTRAVENOUS | Status: DC | PRN
  Administered 2011-08-15: 50 mg via INTRAVENOUS

## 2011-08-15 MED ORDER — KETOROLAC TROMETHAMINE 30 MG/ML IJ SOLN
INTRAMUSCULAR | Status: DC | PRN
  Administered 2011-08-15: 30 mg via INTRAVENOUS

## 2011-08-15 MED ORDER — LACTATED RINGERS IV SOLN
INTRAVENOUS | Status: DC
  Administered 2011-08-15 (×2): via INTRAVENOUS

## 2011-08-15 MED ORDER — MIDAZOLAM HCL 2 MG/2ML IJ SOLN
INTRAMUSCULAR | Status: AC
  Filled 2011-08-15: qty 2

## 2011-08-15 MED ORDER — FENTANYL CITRATE 0.05 MG/ML IJ SOLN
INTRAMUSCULAR | Status: AC
  Filled 2011-08-15: qty 2

## 2011-08-15 MED ORDER — IBUPROFEN 200 MG PO TABS: 800.0000 mg | ORAL_TABLET | Freq: Three times a day (TID) | ORAL | Status: AC | PRN

## 2011-08-15 MED ORDER — BUPIVACAINE HCL 0.5 % IJ SOLN
INTRAMUSCULAR | Status: DC | PRN
  Administered 2011-08-15: 20 mL

## 2011-08-15 MED ORDER — FENTANYL CITRATE 0.05 MG/ML IJ SOLN
INTRAMUSCULAR | Status: DC | PRN
  Administered 2011-08-15: 100 ug via INTRAVENOUS

## 2011-08-15 MED ORDER — PROPOFOL 10 MG/ML IV EMUL
INTRAVENOUS | Status: AC
  Filled 2011-08-15: qty 20

## 2011-08-15 MED ORDER — ONDANSETRON HCL 4 MG/2ML IJ SOLN
INTRAMUSCULAR | Status: AC
  Filled 2011-08-15: qty 2

## 2011-08-15 SURGICAL SUPPLY — 11 items
CATH ROBINSON RED A/P 16FR (CATHETERS) ×2 IMPLANT
CLOTH BEACON ORANGE TIMEOUT ST (SAFETY) ×2 IMPLANT
CONTAINER PREFILL 10% NBF 60ML (FORM) IMPLANT
DRAPE UTILITY XL STRL (DRAPES) ×2 IMPLANT
GLOVE BIO SURGEON STRL SZ 6.5 (GLOVE) ×4 IMPLANT
GOWN PREVENTION PLUS LG XLONG (DISPOSABLE) ×4 IMPLANT
NEEDLE SPNL 18GX3.5 QUINCKE PK (NEEDLE) ×2 IMPLANT
PACK VAGINAL MINOR WOMEN LF (CUSTOM PROCEDURE TRAY) ×2 IMPLANT
PAD PREP 24X48 CUFFED NSTRL (MISCELLANEOUS) ×2 IMPLANT
SYR CONTROL 10ML LL (SYRINGE) ×2 IMPLANT
TOWEL OR 17X24 6PK STRL BLUE (TOWEL DISPOSABLE) ×4 IMPLANT

## 2011-08-15 NOTE — Transfer of Care (Signed)
Immediate Anesthesia Transfer of Care Note  Patient: [name]  Procedure(s) Performed:  DILATATION AND CURETTAGE (D&C)  Patient Location: PACU  Anesthesia Type: General  Level of Consciousness: awake, alert  and oriented  Airway & Oxygen Therapy: Patient Spontanous Breathing and Patient connected to nasal cannula oxygen  Post-op Assessment: Report given to PACU RN and Post -op Vital signs reviewed and stable  Post vital signs: Reviewed and stable  Complications: No apparent anesthesia complications

## 2011-08-15 NOTE — H&P (Signed)
[name] is an 25 y.o. female. She has been experiencing heavy periods since menarche at 25 yo.  She has tried depo provera, OCPs without success.  Her TSH was normal 10-12. An u/s showed small fibroids.  Pertinent Gynecological History: Menses: flow is excessive with use of 5 pads or tampons on heaviest days Bleeding: dysfunctional uterine bleeding Contraception: Depo-Provera injections DES exposure: denies Blood transfusions: none Sexually transmitted diseases: no past history Previous GYN Procedures: none  Last mammogram: n/a Last pap: normal Date: 4-12 OB History: G1, P0A1  Menstrual History: Menarche age: 9 Patient's last menstrual period was 06/19/2011.    Past Medical History  Diagnosis Date  . Polycystic ovarian syndrome   . Asthma   . Hypertension   . DUB (dysfunctional uterine bleeding)   . Morbid obesity     Past Surgical History  Procedure Date  . No past surgeries     Family History  Problem Relation Age of Onset  . Cancer Mother     ovarian  . Diabetes Father     Social History:  reports that she has never smoked. She has never used smokeless tobacco. She reports that she does not drink alcohol or use illicit drugs.  Allergies:  Allergies  Allergen Reactions  . Latex Hives    Prescriptions prior to admission  Medication Sig Dispense Refill  . albuterol (PROVENTIL HFA;VENTOLIN HFA) 108 (90 BASE) MCG/ACT inhaler Inhale 2 puffs into the lungs every 6 (six) hours as needed. For SOB       . ferrous sulfate 325 (65 FE) MG tablet Take 325 mg by mouth daily.        . prenatal vitamin w/FE, FA (PRENATAL 1 + 1) 27-1 MG TABS Take 1 tablet by mouth daily.        . medroxyPROGESTERone (DEPO-PROVERA) 150 MG/ML injection Inject 150 mg into the muscle every 3 (three) months.          ROS Married for 6 months unemployed Blood pressure 137/86, pulse 80, temperature 98.2 F (36.8 C), temperature source Oral, resp. rate 16, height 5\' 7"  (1.702 m),  weight 155.584 kg (343 lb), last menstrual period 06/19/2011, SpO2 100.00%. Physical Exam Heart- rrr without m,r,g Lungs- CTAB Abd- benign, obese Pelvic- [place] secondary to body habitus, no masses palpable Results for orders placed during the hospital encounter of 08/15/11 (from the past 24 hour(s))  CBC     Status: Abnormal   Collection Time   08/15/11  6:40 AM      Component Value Range   WBC 8.1  4.0 - 10.5 (K/uL)   RBC 4.39  3.87 - 5.11 (MIL/uL)   Hemoglobin 9.4 (*) 12.0 - 15.0 (g/dL)   HCT 45.4 (*) 09.8 - 46.0 (%)   MCV 70.4 (*) 78.0 - 100.0 (fL)   MCH 21.4 (*) 26.0 - 34.0 (pg)   MCHC 30.4  30.0 - 36.0 (g/dL)   RDW 11.9 (*) 14.7 - 15.5 (%)   Platelets 328  150 - 400 (K/uL)  PREGNANCY, URINE     Status: Normal   Collection Time   08/15/11  7:06 AM      Component Value Range   Preg Test, Ur NEGATIVE      No results found.  Assessment/Plan: DUB with anemia- I will do a d&c for diagnosis as well as treatment.  Kayvan Hoefling C. 08/15/2011, 7:37 AM

## 2011-08-15 NOTE — Anesthesia Preprocedure Evaluation (Signed)
Anesthesia Evaluation  Patient identified by MRN, date of birth, ID band Patient awake    Reviewed: Allergy & Precautions, H&P , Patient's Chart, lab work & pertinent test results, reviewed documented beta blocker date and time   History of Anesthesia Complications Negative for: history of anesthetic complications  Airway Mallampati: IV TM Distance: >3 FB Neck ROM: full    Dental No notable dental hx.    Pulmonary neg pulmonary ROS, asthma ,  clear to auscultation  Pulmonary exam normal       Cardiovascular Exercise Tolerance: Good hypertension, neg cardio ROS regular Normal    Neuro/Psych Negative Neurological ROS  Negative Psych ROS   GI/Hepatic negative GI ROS, Neg liver ROS,   Endo/Other  Negative Endocrine ROS  Renal/GU negative Renal ROS     Musculoskeletal   Abdominal   Peds  Hematology negative hematology ROS (+)   Anesthesia Other Findings   Reproductive/Obstetrics negative OB ROS                           Anesthesia Physical Anesthesia Plan  ASA: III  Anesthesia Plan: General   Post-op Pain Management:    Induction:   Airway Management Planned:   Additional Equipment:   Intra-op Plan:   Post-operative Plan:   Informed Consent: I have reviewed the patients History and Physical, chart, labs and discussed the procedure including the risks, benefits and alternatives for the proposed anesthesia with the patient or authorized representative who has indicated his/her understanding and acceptance.   Dental Advisory Given  Plan Discussed with: CRNA and Surgeon  Anesthesia Plan Comments:         Anesthesia Quick Evaluation

## 2011-08-15 NOTE — Op Note (Signed)
The risks, benefits, and alternatives of surgery were explained, understood, and accepted. All questions were answered. Consents were signed. I explained to her that this procedure is done for diagnostic purposes as well as hopefully causing the cessation of her dysfunctional uterine bleeding. The operating room. General anesthesia was applied without complication she was placed in the dorsal lithotomy position and her vagina was prepped and draped in the usual sterile fashion a latex free catheter was used to drain her bladder. A bimanual exam revealed a normal size and shape, anteverted mobile uterus. Her adnexa were nonenlarged. A speculum was placed and a single-tooth tenaculum was used to grasp the anterior lip of her nulliparous cervix. A total of 20 mL of 0.5% Marcaine was used to perform a paracervical block. Her uterus sounded to 9 cm. Her cervix was carefully and slowly and easily dilated to accommodate a small curet. A curettage was done in all quadrants and the fundus of the uterus. A small to moderate amount of tissue was obtained. There was no bleeding noted at the end of the case. She was taken to the recovery room after being extubated. She tolerated the procedure well.

## 2011-08-16 ENCOUNTER — Encounter (HOSPITAL_COMMUNITY): Payer: Self-pay | Admitting: Obstetrics & Gynecology

## 2011-08-16 NOTE — Anesthesia Postprocedure Evaluation (Signed)
  Anesthesia Post-op Note  Patient: [name]  Procedure(s) Performed:  DILATATION AND CURETTAGE (D&C)  Patient is awake and responsive. Pain and nausea are reasonably well controlled. Vital signs are stable and clinically acceptable. Oxygen saturation is clinically acceptable. There are no apparent anesthetic complications at this time. Patient is ready for discharge.

## 2011-09-13 ENCOUNTER — Emergency Department (HOSPITAL_COMMUNITY)
Admission: EM | Admit: 2011-09-13 | Discharge: 2011-09-13 | Disposition: A | Discharge status: Home / Self Care | Payer: Self-pay | Attending: Emergency Medicine | Admitting: Emergency Medicine

## 2011-09-13 ENCOUNTER — Encounter (HOSPITAL_COMMUNITY): Payer: Self-pay | Admitting: Emergency Medicine

## 2011-09-13 DIAGNOSIS — L293 Anogenital pruritus, unspecified: Secondary | ICD-10-CM | POA: Insufficient documentation

## 2011-09-13 DIAGNOSIS — R102 Pelvic and perineal pain unspecified side: Secondary | ICD-10-CM

## 2011-09-13 DIAGNOSIS — J45909 Unspecified asthma, uncomplicated: Secondary | ICD-10-CM | POA: Insufficient documentation

## 2011-09-13 DIAGNOSIS — N898 Other specified noninflammatory disorders of vagina: Secondary | ICD-10-CM

## 2011-09-13 DIAGNOSIS — N949 Unspecified condition associated with female genital organs and menstrual cycle: Secondary | ICD-10-CM | POA: Insufficient documentation

## 2011-09-13 DIAGNOSIS — I10 Essential (primary) hypertension: Secondary | ICD-10-CM | POA: Insufficient documentation

## 2011-09-13 LAB — PREGNANCY, URINE: Preg Test, Ur: NEGATIVE

## 2011-09-13 LAB — URINALYSIS, ROUTINE W REFLEX MICROSCOPIC
Glucose, UA: NEGATIVE mg/dL
Ketones, ur: NEGATIVE mg/dL
Leukocytes, UA: NEGATIVE
pH: 6.5 (ref 5.0–8.0)

## 2011-09-13 LAB — WET PREP, GENITAL
Clue Cells Wet Prep HPF POC: NONE SEEN
Trich, Wet Prep: NONE SEEN
Yeast Wet Prep HPF POC: NONE SEEN

## 2011-09-13 MED ORDER — CEFTRIAXONE SODIUM 250 MG IJ SOLR
250.0000 mg | Freq: Once | INTRAMUSCULAR | Status: AC
  Administered 2011-09-13: 250 mg via INTRAMUSCULAR
  Filled 2011-09-13: qty 250

## 2011-09-13 MED ORDER — LIDOCAINE HCL (PF) 1 % IJ SOLN
INTRAMUSCULAR | Status: AC
  Filled 2011-09-13: qty 5

## 2011-09-13 MED ORDER — AZITHROMYCIN 1 G PO PACK
1.0000 g | PACK | Freq: Once | ORAL | Status: AC
  Administered 2011-09-13: 1 g via ORAL
  Filled 2011-09-13: qty 1

## 2011-09-13 NOTE — ED Notes (Signed)
Pt. C/o vaginal itching onset 3 days ago, states it burns if she wipes , however denies any burning with urination. C/o vaginal discharge. LMP was Sept. States that is normal for her she is taking the Depo shot. G2Po states she has had 2 miscarriages. Sexually active. Married.

## 2011-09-13 NOTE — ED Notes (Signed)
PT. REPORTS VAGINAL ITCHING / DISCHARGE FOR 3 DAYS .

## 2011-09-13 NOTE — ED Provider Notes (Addendum)
History     CSN: [id number] Arrival date & time: 09/13/2011  1:24 AM   First MD Initiated Contact with Patient 09/13/11 [phone]      Chief Complaint  Patient presents with  . Vaginal Itching    (Consider location/radiation/quality/duration/timing/severity/associated sxs/prior treatment) HPI Comments: Patient has had vaginal itching, burning, discharge for the past few days.  Sexually active with husband.  LMP in September but is on depo shot.  Patient is a 25 y.o. female presenting with vaginal itching. The history is provided by the patient.  Vaginal Itching This is a new problem. The current episode started more than 2 days ago. The problem occurs constantly. The problem has been gradually worsening. The symptoms are aggravated by nothing. The symptoms are relieved by nothing.    Past Medical History  Diagnosis Date  . Polycystic ovarian syndrome   . Asthma   . Hypertension   . DUB (dysfunctional uterine bleeding)   . Morbid obesity     Past Surgical History  Procedure Date  . No past surgeries   . Dilation and curettage of uterus 08/15/2011    Procedure: DILATATION AND CURETTAGE (D&C);  Surgeon: Leanora Ivanoff. Marice Potter, MD;  Location: [facility];  Service: Gynecology;  Laterality: N/A;    Family History  Problem Relation Age of Onset  . Cancer Mother     ovarian  . Diabetes Father     History  Substance Use Topics  . Smoking status: Never Smoker   . Smokeless tobacco: Never Used  . Alcohol Use: No    OB History    Grav Para Term Preterm Abortions TAB SAB Ect Mult Living   1 0 0 0 1 0 1 0 0 0       Review of Systems  All other systems reviewed and are negative.    Allergies  Latex  Home Medications   Current Outpatient Rx  Name Route Sig Dispense Refill  . ALBUTEROL SULFATE HFA 108 (90 BASE) MCG/ACT IN AERS Inhalation Inhale 2 puffs into the lungs every 6 (six) hours as needed. For SOB     . MEDROXYPROGESTERONE ACETATE 150 MG/ML IM SUSP Intramuscular Inject 150 mg  into the muscle every 3 (three) months.      Marland Kitchen PRENATAL PLUS 27-1 MG PO TABS Oral Take 1 tablet by mouth daily.        BP 135/74  Pulse 78  Temp(Src) 98.6 F (37 C) (Oral)  Resp 19  SpO2 98%  LMP 06/19/2011  Physical Exam  Nursing note and vitals reviewed. Constitutional: She is oriented to person, place, and time. She appears well-developed and well-nourished. No distress.  HENT:  Head: Normocephalic and atraumatic.  Neck: Normal range of motion. Neck supple.  Abdominal: Soft. Bowel sounds are normal.  Musculoskeletal: Normal range of motion.  Neurological: She is alert and oriented to person, place, and time.  Skin: Skin is warm and dry. She is not diaphoretic.    ED Course  Procedures (including critical care time)  Labs Reviewed - No data to display No results found.   No diagnosis found.    MDM  Many wbcs on wet prep.  Will treat ceftriaxone, zmax.       Geoffery Lyons, MD 09/13/11 1191  Geoffery Lyons, MD 09/13/11 [phone]

## 2011-09-14 LAB — GC/CHLAMYDIA PROBE AMP, GENITAL: Chlamydia, DNA Probe: NEGATIVE

## 2011-10-05 ENCOUNTER — Encounter: Payer: Self-pay | Admitting: Obstetrics & Gynecology

## 2011-10-05 ENCOUNTER — Ambulatory Visit (INDEPENDENT_AMBULATORY_CARE_PROVIDER_SITE_OTHER): Payer: Self-pay | Admitting: Obstetrics & Gynecology

## 2011-10-05 VITALS — BP 131/89 | HR 96 | Temp 97.2°F | Ht 67.0 in | Wt 353.2 lb

## 2011-10-05 DIAGNOSIS — Z09 Encounter for follow-up examination after completed treatment for conditions other than malignant neoplasm: Secondary | ICD-10-CM

## 2011-10-05 DIAGNOSIS — Z9889 Other specified postprocedural states: Secondary | ICD-10-CM

## 2011-10-05 NOTE — Progress Notes (Signed)
Addended by: Lynnell Dike on: 10/05/2011 02:28 PM   Modules accepted: Orders

## 2011-10-05 NOTE — Progress Notes (Signed)
  Subjective:    Patient ID: [name], female    DOB: 1986/02/05, 25 y.o.   MRN: [id number]  HPI  She is here for a post op visit after a D&C. She has had no bleeding since surgery. Her only complaint is that of a "split" at her introitus over the past few weeks off and on. She has been having "lots" of sex since surgery. She would like to get pregnant. Her husband has fathered 2 kids (ages 46 and 1). Review of Systems Pap normal 4/12    Objective:   Physical Exam No lesions noted on vulva       Assessment & Plan:  Post op doing well.  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 Her husband probably does not need a semen analysis.

## 2011-10-17 ENCOUNTER — Telehealth: Payer: Self-pay

## 2011-10-17 ENCOUNTER — Telehealth: Payer: Self-pay | Admitting: *Deleted

## 2011-10-17 ENCOUNTER — Other Ambulatory Visit: Payer: Self-pay

## 2011-10-17 MED ORDER — VALACYCLOVIR HCL 500 MG PO TABS: 500.0000 mg | ORAL_TABLET | Freq: Every day | ORAL | Status: DC

## 2011-10-17 NOTE — Telephone Encounter (Signed)
Message copied by Faythe Casa on Mon Oct 17, 2011  2:29 PM ------      Message from: [place], [place] L      Created: Mon Oct 17, 2011  1:47 PM      Regarding: This pt is from the [facility]                   ----- Message -----         From: Myra C. Marice Potter, MD         Sent: 10/17/2011  11:33 AM           To: Blossom Hoops, RN            Burnett Kanaris, she needs some Valtrex 500 mg daily if she wants continuous treatment or 1 gram daily for 5 days if she wants episodic treatment.

## 2011-10-17 NOTE — Telephone Encounter (Signed)
Called pt and informed pt of her + results of herpes and that she currently is having an outbreak per [place], [place]. Informed pt what herpes is and pt stated understanding.  I informed her of her Rx of Valtrex and that it would need to be taken daily continuous for the rest of her life to suppress her outbreaks.  Verified pharmacy with pt and stated that if she has any further questions to please contact us.  Pt stated understanding.

## 2011-10-17 NOTE — Telephone Encounter (Signed)
Pt left message stating that she just received some test results and has additional questions.

## 2011-10-18 NOTE — Telephone Encounter (Signed)
Returned pt call- left message that I will call back later today.

## 2011-10-19 NOTE — Telephone Encounter (Signed)
Called pt she states that she has spoken with someone and picked up a copy of her results and has no further questions at this time.

## 2011-12-21 ENCOUNTER — Telehealth: Payer: Self-pay | Admitting: *Deleted

## 2011-12-21 NOTE — Telephone Encounter (Signed)
Called pt and discussed her concern- she has current Herpes outbreak.  I stated that I will be able to contact the doctor tomorrow for alternate medication order and will call her back. Pt voiced understanding.

## 2011-12-21 NOTE — Telephone Encounter (Signed)
Patient called and left a message she was prescribed some medicine to take daily for STD, however the medicine is too expensive - it cost $73, they also gave me something else called acyclovir 200mg  and it is cheaper than the original medicine. Please give me a call back

## 2011-12-22 MED ORDER — ACYCLOVIR 400 MG PO TABS: ORAL_TABLET | ORAL | Status: DC

## 2011-12-22 NOTE — Telephone Encounter (Signed)
Called pt and informed her that a new Rx has been sent to her pharmacy. She can pick it up later today. Pt voiced understanding.

## 2012-01-09 ENCOUNTER — Encounter (HOSPITAL_COMMUNITY): Payer: Self-pay | Admitting: *Deleted

## 2012-01-09 ENCOUNTER — Emergency Department (HOSPITAL_COMMUNITY)
Admission: EM | Admit: 2012-01-09 | Discharge: 2012-01-09 | Disposition: A | Discharge status: Home / Self Care | Payer: Self-pay | Attending: Emergency Medicine | Admitting: Emergency Medicine

## 2012-01-09 DIAGNOSIS — J45909 Unspecified asthma, uncomplicated: Secondary | ICD-10-CM | POA: Insufficient documentation

## 2012-01-09 DIAGNOSIS — I10 Essential (primary) hypertension: Secondary | ICD-10-CM | POA: Insufficient documentation

## 2012-01-09 DIAGNOSIS — R51 Headache: Secondary | ICD-10-CM | POA: Insufficient documentation

## 2012-01-09 MED ORDER — METOCLOPRAMIDE HCL 5 MG/ML IJ SOLN
10.0000 mg | Freq: Once | INTRAMUSCULAR | Status: AC
  Administered 2012-01-09: 10 mg via INTRAMUSCULAR
  Filled 2012-01-09: qty 2

## 2012-01-09 MED ORDER — ONDANSETRON 4 MG PO TBDP
4.0000 mg | ORAL_TABLET | Freq: Once | ORAL | Status: AC
  Administered 2012-01-09: 4 mg via ORAL
  Filled 2012-01-09: qty 1

## 2012-01-09 MED ORDER — KETOROLAC TROMETHAMINE 60 MG/2ML IM SOLN
60.0000 mg | Freq: Once | INTRAMUSCULAR | Status: AC
  Administered 2012-01-09: 60 mg via INTRAMUSCULAR
  Filled 2012-01-09: qty 2

## 2012-01-09 MED ORDER — DIPHENHYDRAMINE HCL 50 MG/ML IJ SOLN
25.0000 mg | Freq: Once | INTRAMUSCULAR | Status: AC
  Administered 2012-01-09: 25 mg via INTRAMUSCULAR
  Filled 2012-01-09: qty 1

## 2012-01-09 NOTE — ED Provider Notes (Signed)
History     CSN: [id number]  Arrival date & time 01/09/12  1510   First MD Initiated Contact with Patient 01/09/12 1639      Chief Complaint  Patient presents with  . Migraine    (Consider location/radiation/quality/duration/timing/severity/associated sxs/prior treatment) HPI Comments: Patient complains of diffuse gradual onset headache that has become progressively worse over the past 4 days. 2 days ago she developed nausea and vomiting and been unable to keep anything down. The headache is associated with photophobia and phonophobia. Denies any fever, chills, difficulty breathing, chest pain. She denies any visual change, neck pain or stiffness. She's had headaches like this in the past. Denies any sick contacts.  The history is provided by the patient.    Past Medical History  Diagnosis Date  . Polycystic ovarian syndrome   . Asthma   . Hypertension   . DUB (dysfunctional uterine bleeding)   . Morbid obesity     Past Surgical History  Procedure Date  . No past surgeries   . Dilation and curettage of uterus 08/15/2011    Procedure: DILATATION AND CURETTAGE (D&C);  Surgeon: Leanora Ivanoff. Marice Potter, MD;  Location: [facility];  Service: Gynecology;  Laterality: N/A;    Family History  Problem Relation Age of Onset  . Cancer Mother     ovarian  . Diabetes Mother   . Asthma Mother   . Diabetes Father   . Asthma Father   . Asthma Sister   . Asthma Brother     History  Substance Use Topics  . Smoking status: Never Smoker   . Smokeless tobacco: Never Used  . Alcohol Use: No    OB History    Grav Para Term Preterm Abortions TAB SAB Ect Mult Living   1 0 0 0 1 0 1 0 0 0       Review of Systems  Constitutional: Positive for activity change and appetite change. Negative for fever.  HENT: Negative for congestion, rhinorrhea, neck pain and neck stiffness.   Eyes: Negative for photophobia.  Respiratory: Negative for cough, chest tightness and shortness of breath.   Cardiovascular:  Negative for chest pain.  Gastrointestinal: Positive for nausea and vomiting. Negative for abdominal pain and diarrhea.  Genitourinary: Negative for dysuria, hematuria, vaginal bleeding and vaginal discharge.  Musculoskeletal: Negative for back pain.  Neurological: Positive for headaches. Negative for weakness.    Allergies  Latex  Home Medications   Current Outpatient Rx  Name Route Sig Dispense Refill  . ALBUTEROL SULFATE HFA 108 (90 BASE) MCG/ACT IN AERS Inhalation Inhale 2 puffs into the lungs every 6 (six) hours as needed. For SOB    . PRENATAL PLUS 27-1 MG PO TABS Oral Take 1 tablet by mouth daily.      Marland Kitchen VALACYCLOVIR HCL 500 MG PO TABS Oral Take 500 mg by mouth daily.      BP 148/88  Pulse 86  Temp(Src) 98.8 F (37.1 C) (Oral)  Resp 18  SpO2 100%  Physical Exam  Constitutional: She is oriented to person, place, and time. She appears well-developed and well-nourished. No distress.  HENT:  Head: Normocephalic.  Mouth/Throat: Oropharynx is clear and moist. No oropharyngeal exudate.  Eyes: Conjunctivae and EOM are normal. Pupils are equal, round, and reactive to light.  Neck: Normal range of motion. Neck supple.       No meningismus  Cardiovascular: Normal rate, regular rhythm and normal heart sounds.   Pulmonary/Chest: Effort normal and breath sounds normal. No respiratory distress.  Abdominal: Soft. There is no tenderness. There is no rebound and no guarding.  Musculoskeletal: Normal range of motion. She exhibits no edema and no tenderness.  Neurological: She is alert and oriented to person, place, and time. No cranial nerve deficit.       5 out of 5 strength throughout, no focal neuro deficits, optic discs unable to be visualized  Skin: Skin is warm.    ED Course  Procedures (including critical care time)  Labs Reviewed - No data to display No results found.   No diagnosis found.    MDM  Gradual onset headache similar to previous. Associated with nausea  vomiting photophobia.  Concern for pseudotumor cerebri based on the patient's body habitus.  Headache improved with medications. Neurological exam remains normal. Patient tolerating by mouth liquids.      Glynn Octave, MD 01/09/12 1745

## 2012-01-09 NOTE — Discharge Instructions (Signed)

## 2012-01-09 NOTE — ED Notes (Signed)
Pt provided sprite 

## 2012-01-09 NOTE — ED Notes (Signed)
Pt ambulated to discharge without difficulty. NAD. A & o x 4.

## 2012-01-09 NOTE — ED Notes (Signed)
The pt has had a headache for 4 days she has a history of migraine headaches.  lmp  Sometime last year.  She was on the depo shot

## 2012-03-01 ENCOUNTER — Ambulatory Visit (INDEPENDENT_AMBULATORY_CARE_PROVIDER_SITE_OTHER): Payer: Self-pay | Admitting: Obstetrics & Gynecology

## 2012-03-01 VITALS — BP 160/98 | HR 86 | Temp 97.6°F | Ht 68.0 in | Wt 353.0 lb

## 2012-03-01 DIAGNOSIS — N926 Irregular menstruation, unspecified: Secondary | ICD-10-CM

## 2012-03-01 DIAGNOSIS — N912 Amenorrhea, unspecified: Secondary | ICD-10-CM

## 2012-03-01 DIAGNOSIS — N938 Other specified abnormal uterine and vaginal bleeding: Secondary | ICD-10-CM

## 2012-03-01 DIAGNOSIS — N911 Secondary amenorrhea: Secondary | ICD-10-CM

## 2012-03-01 DIAGNOSIS — R112 Nausea with vomiting, unspecified: Secondary | ICD-10-CM | POA: Insufficient documentation

## 2012-03-01 MED ORDER — OMEPRAZOLE 40 MG PO CPDR: 40.0000 mg | DELAYED_RELEASE_CAPSULE | Freq: Every day | ORAL | Status: DC

## 2012-03-01 MED ORDER — MEDROXYPROGESTERONE ACETATE 5 MG PO TABS
5.0000 mg | ORAL_TABLET | Freq: Every day | ORAL | Status: DC
Start: 2012-03-01 — End: 2012-05-16

## 2012-03-01 NOTE — Patient Instructions (Signed)
Infertility WHAT IS INFERTILITY?  Infertility is usually defined as not being able to get pregnant after trying for one year of regular sexual intercourse without the use of contraceptives. Or not being able to carry a pregnancy to term and have a baby. The infertility rate in the [place] is around 10%. Pregnancy is the result of a chain of events. A woman must release an egg from one of her ovaries (ovulation). The egg must be fertilized by the female sperm. Then it travels through a fallopian tube into the uterus (womb), where it attaches to the wall of the uterus and grows. A man must have enough sperm, and the sperm must join with (fertilize) the egg along the way, at the proper time. The fertilized egg must then become attached to the inside of the uterus. While this may seem simple, many things can happen to prevent pregnancy from occurring.  WHOSE PROBLEM IS IT?  About 20% of infertility cases are due to problems with the man (female factors) and 65% are due to problems with the woman (female factors). Other cases are due to a combination of female and female factors or to unknown causes.  WHAT CAUSES INFERTILITY IN MEN?  Infertility in men is often caused by problems with making enough normal sperm or getting the sperm to reach the egg. Problems with sperm may exist from birth or develop later in life, due to illness or injury. Some men produce no sperm, or produce too few sperm (oligospermia). Other problems include:  Sexual dysfunction.   Hormonal or endocrine problems.   Age. Female fertility decreases with age, but not at as young an age as female fertility.   Infection.   Congenital problems. Birth defect, such as absence of the tubes that carry the sperm (vas deferens).   Genetic/chromosomal problems.   Antisperm antibody problems.   Retrograde ejaculation (sperm go into the bladder).   Varicoceles, spematoceles, or tumors of the testicles.   Lifestyle can influence the number  and quality of a man's sperm.   Alcohol and drugs can temporarily reduce sperm quality.   Environmental toxins, including pesticides and lead, may cause some cases of infertility in men.  WHAT CAUSES INFERTILITY IN WOMEN?   Problems with ovulation account for most infertility in women. Without ovulation, eggs are not available to be fertilized.   Signs of problems with ovulation include irregular menstrual periods or no periods at all.   Simple lifestyle factors, including stress, diet, or athletic training, can affect a woman's hormonal balance.   Age. Fertility begins to decrease in women in the early 30s and is worse after age 37.   Much less often, a hormonal imbalance from a serious medical problem, such as a pituitary gland tumor, thyroid or other chronic medical disease, can cause ovulation problems.   Pelvic infections.   Polycystic ovary syndrome (increase in female hormones, unable to ovulate).   Alcohol or illegal drugs.   Environmental toxins, radiation, pesticides, and certain chemicals.   Aging is an important factor in female infertility.   The ability of a woman's ovaries to produce eggs declines with age, especially after age 35. About one third of couples where the woman is over 35 will have problems with fertility.   By the time she reaches menopause when her monthly periods stop for good, a woman can no longer produce eggs or become pregnant.   Other problems can also lead to infertility in women. If the fallopian tubes are blocked   at one or both ends, the egg cannot travel through the tubes into the uterus. Scar tissue (adhesions) in the pelvis may cause blocked tubes. This may result from pelvic inflammatory disease, endometriosis, or surgery for an ectopic pregnancy (fertilized egg implanted outside the uterus) or any pelvic or abdominal surgery causing adhesions.   Fibroid tumors or polyps of the uterus.   Congenital (birth defect) abnormalities of the uterus.    Infection of the cervix (cervicitis).   Cervical stenosis (narrowing).   Abnormal cervical mucus.   Polycystic ovary syndrome.   Having sexual intercourse too often (every other day or 4 to 5 times a week).   Obesity.   Anorexia.   Poor nutrition.   Over exercising, with loss of body fat.   DES. Your mother received diethylstilbesterol hormone when pregnant with you.  HOW IS INFERTILITY TESTED?  If you have been trying to have a baby without success, you may want to seek medical help. You should not wait for one year of trying before seeing a health care provider if:  You are over 35.   You have reason to believe that there may be a fertility problem.  A medical evaluation may determine the reasons for a couple's infertility. Usually this process begins with:  Physical exams.   Medical histories of both partners.   Sexual histories of both partners.  If there is no obvious problem, like improperly timed intercourse or absence of ovulation, tests may be needed.   For a man, testing usually begins with tests of his semen to look at:   The number of sperm.   The shape of sperm.   Movement of his sperm.   Taking a complete medical and surgical history.   Physical examination.   Check for infection of the female reproductive organs.  Sometimes hormone tests are done.   For a woman, the first step in testing is to find out if she is ovulating each month. There are several ways to do this. For example, she can keep track of changes in her morning body temperature and in the texture of her cervical mucus. Another tool is a home ovulation test kit, which can be bought at drug or grocery stores.   Checks of ovulation can also be done in the doctor's office, using blood tests for hormone levels or ultrasound tests of the ovaries. If the woman is ovulating, more tests will need to be done. Some common female tests include:   Hysterosalpingogram: An x-ray of the fallopian  tubes and uterus after they are injected with dye. It shows if the tubes are open and shows the shape of the uterus.   Laparoscopy: An exam of the tubes and other female organs for disease. A lighted tube called a laparoscope is used to see inside the abdomen.   Endometrial biopsy: Sample of uterus tissue taken on the first day of the menstrual period, to see if the tissue indicates you are ovulating.   Transvaginal ultrasound: Examines the female organs.   Hysteroscopy: Uses a lighted tube to examine the cervix and inside the uterus, to see if there are any abnormalities inside the uterus.  TREATMENT  Depending on the test results, different treatments can be suggested. The type of treatment depends on the cause. 85 to 90% of infertility cases are treated with drugs or surgery.   Various fertility drugs may be used for women with ovulation problems. It is important to talk with your caregiver about the drug to   be used. You should understand the drug's benefits and side effects. Depending on the type of fertility drug and the dosage of the drug used, multiple births (twins or multiples) can occur in some women.   If needed, surgery can be done to repair damage to a woman's ovaries, fallopian tubes, cervix, or uterus.   Surgery or medical treatment for endometriosis or polycystic ovary syndrome. Sometimes a man has an infertility problem that can be corrected with medicine or by surgery.   Intrauterine insemination (IUI) of sperm, timed with ovulation.   Change in lifestyle, if that is the cause (lose weight, increase exercise, and stop smoking, drinking excessively, or taking illegal drugs).   Other types of surgery:   Removing growths inside and on the uterus.   Removing scar tissue from inside of the uterus.   Fixing blocked tubes.   Removing scar tissue in the pelvis and around the female organs.  WHAT IS ASSISTED REPRODUCTIVE TECHNOLOGY (ART)?  Assisted reproductive technology  (ART) is another form of special methods used to help infertile couples. ART involves handling both the woman's eggs and the man's sperm. Success rates vary and depend on many factors. ART can be expensive and time-consuming. But ART has made it possible for many couples to have children that otherwise would not have been conceived. Some methods are listed below:  In vitro fertilization (IVF). IVF is often used when a woman's fallopian tubes are blocked or when a man has low sperm counts. A drug is used to stimulate the ovaries to produce multiple eggs. Once mature, the eggs are removed and placed in a culture dish with the man's sperm for fertilization. After about 40 hours, the eggs are examined to see if they have become fertilized by the sperm and are dividing into cells. These fertilized eggs (embryos) are then placed in the woman's uterus. This bypasses the fallopian tubes.   Gamete intrafallopian transfer (GIFT) is similar to IVF, but used when the woman has at least one normal fallopian tube. Three to five eggs are placed in the fallopian tube, along with the man's sperm, for fertilization inside the woman's body.   Zygote intrafallopian transfer (ZIFT), also called tubal embryo transfer, combines IVF and GIFT. The eggs retrieved from the woman's ovaries are fertilized in the lab and placed in the fallopian tubes rather than in the uterus.   ART procedures sometimes involve the use of donor eggs (eggs from another woman) or previously frozen embryos. Donor eggs may be used if a woman has impaired ovaries or has a genetic disease that could be passed on to her baby.   When performing ART, you are at higher risk for resulting in multiple pregnancies, twins, triplets or more.   Intracytoplasma sperm injection is a procedure that injects a single sperm into the egg to fertilize it.   Embryo transplant is a procedure that starts after growing an embryo in a special media (chemical solution)  developed to keep the embryo alive for 2 to 5 days, and then transplanting it into the uterus.  In cases where a cause cannot be found and pregnancy does not occur, adoption may be a consideration. Document Released: 09/29/2003 Document Revised: 09/15/2011 Document Reviewed: 08/25/2009 [employer] Patient Information 2012 [employer]. 

## 2012-03-01 NOTE — Progress Notes (Signed)
Patient ID: [name], female   DOB: 1986/09/13, 26 y.o.   MRN: [id number]  Chief Complaint  Patient presents with  . Pelvic Pain  . Amenorrhea    HPI [name] is a 26 y.o. female.  Last menstrual period was July 18 2011. In November she had a dilation and curettage. Specimen was benign. Ultrasound in October was normal with a 3 mm endometrial stripe. She recently noticed a clear discharge followed by a white milky discharge. Her last Pap smear was April 2012. HPI  Past Medical History  Diagnosis Date  . Polycystic ovarian syndrome   . Asthma   . Hypertension   . DUB (dysfunctional uterine bleeding)   . Morbid obesity     Past Surgical History  Procedure Date  . No past surgeries   . Dilation and curettage of uterus 08/15/2011    Procedure: DILATATION AND CURETTAGE (D&C);  Surgeon: Leanora Ivanoff. Marice Potter, MD;  Location: [facility];  Service: Gynecology;  Laterality: N/A;    Family History  Problem Relation Age of Onset  . Cancer Mother     ovarian  . Diabetes Mother   . Asthma Mother   . Diabetes Father   . Asthma Father   . Asthma Sister   . Asthma Brother     Social History History  Substance Use Topics  . Smoking status: Never Smoker   . Smokeless tobacco: Never Used  . Alcohol Use: No    Allergies  Allergen Reactions  . Latex Hives    Current Outpatient Prescriptions  Medication Sig Dispense Refill  . albuterol (PROVENTIL HFA;VENTOLIN HFA) 108 (90 BASE) MCG/ACT inhaler Inhale 2 puffs into the lungs every 6 (six) hours as needed. For SOB      . prenatal vitamin w/FE, FA (PRENATAL 1 + 1) 27-1 MG TABS Take 1 tablet by mouth daily.        . valACYclovir (VALTREX) 500 MG tablet Take 500 mg by mouth daily.      . medroxyPROGESTERone (PROVERA) 5 MG tablet Take 1 tablet (5 mg total) by mouth daily.  10 tablet  2  . omeprazole (PRILOSEC) 40 MG capsule Take 1 capsule (40 mg total) by mouth daily.  30 capsule  1  . DISCONTD: omeprazole (PRILOSEC) 40 MG  capsule Take 1 capsule (40 mg total) by mouth daily.  30 capsule  1    Review of Systems Review of Systems Clear vaginal discharge followed by a white milky discharge. No dysuria or itching or burning. She occasionally has some irritation at her vulva like a vaginal splint. She currently does not have that symptom. When her menses come they are often heavy. In the last 2 weeks she's had nausea after eating with vomiting. No weight change. No heartburn. Blood pressure 160/98, pulse 86, temperature 97.6 F (36.4 C), temperature source Oral, height 5\' 8"  (1.727 m), weight 353 lb (160.12 kg), last menstrual period 07/18/2011.  Physical Exam Physical Exam  Constitutional: She is oriented to person, place, and time. No distress.       Morbidly obese  Abdominal: Soft. She exhibits no distension and no mass. There is no tenderness. There is no guarding.       Morbidly obese  Genitourinary: Vagina normal and uterus normal.       Clear cervical mucus at the os. No mass or tenderness  Musculoskeletal: She exhibits edema.       Trace ankle edema  Neurological: She is alert and oriented to person,  place, and time.  Skin: Skin is warm and dry.  Psychiatric: She has a normal mood and affect. Her behavior is normal.    Data Reviewed Surgical pathology and pelvic ultrasound  Assessment    Secondary amenorrhea with anovulation and infertility.    Plan    Provera challenge 5 mg by mouth for 10 days. Due to her nausea I will try treatment for gastroesophageal reflux with Prilosec 40 mg a day. Pap smear was done today. Wet prep was also sent. She will return in 2 months to see if she has normal withdrawal bleed. If she is interested in infertility treatment she would be a candidate for ovulation induction.  Tresea Heine 03/01/2012 4:05 PM        Kale Dols 03/01/2012, 3:58 PM

## 2012-03-02 LAB — WET PREP, GENITAL
Trich, Wet Prep: NONE SEEN
WBC, Wet Prep HPF POC: NONE SEEN
Yeast Wet Prep HPF POC: NONE SEEN

## 2012-03-10 DIAGNOSIS — A549 Gonococcal infection, unspecified: Secondary | ICD-10-CM

## 2012-03-10 HISTORY — DX: Gonococcal infection, unspecified: A54.9

## 2012-05-16 ENCOUNTER — Inpatient Hospital Stay (HOSPITAL_COMMUNITY)
Admission: AD | Admit: 2012-05-16 | Discharge: 2012-05-16 | Disposition: A | Discharge status: Home / Self Care | Payer: Self-pay | Source: Ambulatory Visit | Attending: Family Medicine | Admitting: Family Medicine

## 2012-05-16 ENCOUNTER — Encounter (HOSPITAL_COMMUNITY): Payer: Self-pay | Admitting: *Deleted

## 2012-05-16 ENCOUNTER — Inpatient Hospital Stay (HOSPITAL_COMMUNITY): Payer: Self-pay

## 2012-05-16 DIAGNOSIS — N760 Acute vaginitis: Secondary | ICD-10-CM

## 2012-05-16 DIAGNOSIS — A499 Bacterial infection, unspecified: Secondary | ICD-10-CM | POA: Insufficient documentation

## 2012-05-16 DIAGNOSIS — B9689 Other specified bacterial agents as the cause of diseases classified elsewhere: Secondary | ICD-10-CM

## 2012-05-16 DIAGNOSIS — R109 Unspecified abdominal pain: Secondary | ICD-10-CM | POA: Insufficient documentation

## 2012-05-16 HISTORY — DX: Gonococcal infection, unspecified: A54.9

## 2012-05-16 HISTORY — DX: Herpesviral infection of urogenital system, unspecified: A60.00

## 2012-05-16 LAB — CBC
MCH: 20.7 pg — ABNORMAL LOW (ref 26.0–34.0)
MCHC: 30.4 g/dL (ref 30.0–36.0)
MCV: 68.1 fL — ABNORMAL LOW (ref 78.0–100.0)
Platelets: 309 10*3/uL (ref 150–400)

## 2012-05-16 LAB — WET PREP, GENITAL
Trich, Wet Prep: NONE SEEN
Yeast Wet Prep HPF POC: NONE SEEN

## 2012-05-16 LAB — COMPREHENSIVE METABOLIC PANEL
ALT: 14 U/L (ref 0–35)
AST: 17 U/L (ref 0–37)
Calcium: 9.4 mg/dL (ref 8.4–10.5)
Creatinine, Ser: 0.78 mg/dL (ref 0.50–1.10)
GFR calc Af Amer: >90 mL/min (ref >90)
Glucose, Bld: 73 mg/dL (ref 70–99)
Sodium: 140 mEq/L (ref 135–145)
Total Protein: 7.1 g/dL (ref 6.0–8.3)

## 2012-05-16 LAB — URINALYSIS, ROUTINE W REFLEX MICROSCOPIC
Ketones, ur: NEGATIVE mg/dL
Leukocytes, UA: NEGATIVE
Nitrite: NEGATIVE
Protein, ur: NEGATIVE mg/dL

## 2012-05-16 MED ORDER — METRONIDAZOLE 500 MG PO TABS: 500.0000 mg | ORAL_TABLET | Freq: Two times a day (BID) | ORAL | Status: AC

## 2012-05-16 MED ORDER — KETOROLAC TROMETHAMINE 60 MG/2ML IM SOLN
60.0000 mg | Freq: Once | INTRAMUSCULAR | Status: AC
  Administered 2012-05-16: 60 mg via INTRAMUSCULAR
  Filled 2012-05-16: qty 2

## 2012-05-16 NOTE — MAU Note (Signed)
Pt unable to sign e-Signature.  E-Sign would not launch.  Pt rated pain of 4.  Discharged pt to home.

## 2012-05-16 NOTE — MAU Note (Signed)
Past couple of days vomiting non-stop. Pain in lower abdominal as well as back aches. Fever the first two days which has gone done some. Pain is unbearable.

## 2012-05-16 NOTE — MAU Provider Note (Signed)
History     CSN: [id number]  Arrival date and time: 05/16/12 4132   First Provider Initiated Contact with Patient 05/16/12 [phone]      Chief Complaint  Patient presents with  . Abdominal Pain   HPI [name] is a 26 y.o. female who presents to MAU with abdominal pain. The pain started 2 days ago. Was at work this morning and the pain got worse. The pain is located in the mid lower abdomen.she describes the pain as constant, sharp. The pain is better with lying on side. Last took ASA for pain 2 days ago. Associated symptoms include nausea and vomiting.  Saw Dr. Debroah Loop last month with abnormal pain and vaginal bleeding. Given Rx for provera. Patient states she took the medication and bleeding stopped. Had a normal period 7/13. Plans for infertility consult. Last pap smear more than one year ago and was normal. Current sex partner x 4 years. Hx of GC and HSV.  The history was provided by the patient.  OB History    Grav Para Term Preterm Abortions TAB SAB Ect Mult Living   1 0 0 0 1 0 1 0 0 0       Past Medical History  Diagnosis Date  . Polycystic ovarian syndrome   . Asthma   . Hypertension   . DUB (dysfunctional uterine bleeding)   . Morbid obesity   . Gonorrhea June 2013  . Genital HSV     Past Surgical History  Procedure Date  . No past surgeries   . Dilation and curettage of uterus 08/15/2011    Procedure: DILATATION AND CURETTAGE (D&C);  Surgeon: Leanora Ivanoff. Marice Potter, MD;  Location: [facility];  Service: Gynecology;  Laterality: N/A;    Family History  Problem Relation Age of Onset  . Cancer Mother     ovarian  . Diabetes Mother   . Asthma Mother   . Diabetes Father   . Asthma Father   . Asthma Sister   . Asthma Brother     History  Substance Use Topics  . Smoking status: Never Smoker   . Smokeless tobacco: Never Used  . Alcohol Use: No    Allergies:  Allergies  Allergen Reactions  . Latex Hives    Prescriptions prior to admission  Medication Sig  Dispense Refill  . albuterol (PROVENTIL HFA;VENTOLIN HFA) 108 (90 BASE) MCG/ACT inhaler Inhale 2 puffs into the lungs every 6 (six) hours as needed. For SOB      . aspirin 325 MG tablet Take 325 mg by mouth every 4 (four) hours as needed.      . valACYclovir (VALTREX) 500 MG tablet Take 500 mg by mouth daily.      . medroxyPROGESTERone (PROVERA) 5 MG tablet Take 1 tablet (5 mg total) by mouth daily.  10 tablet  2  . omeprazole (PRILOSEC) 40 MG capsule Take 1 capsule (40 mg total) by mouth daily.  30 capsule  1  . prenatal vitamin w/FE, FA (PRENATAL 1 + 1) 27-1 MG TABS Take 1 tablet by mouth daily.          Review of Systems  Constitutional: Positive for fever. Negative for chills and weight loss.  HENT: Negative for ear pain, nosebleeds, congestion, sore throat and neck pain.   Eyes: Negative for blurred vision, double vision, photophobia and pain.  Respiratory: Negative for cough, shortness of breath and wheezing.   Cardiovascular: Negative for chest pain, palpitations and leg swelling.  Gastrointestinal: Positive for nausea,  vomiting and abdominal pain. Negative for heartburn, diarrhea and constipation.  Genitourinary: Negative for dysuria, urgency and frequency.  Musculoskeletal: Positive for back pain. Negative for myalgias.  Skin: Negative for itching and rash.  Neurological: Positive for headaches. Negative for dizziness, sensory change, speech change, seizures and weakness.  Endo/Heme/Allergies: Does not bruise/bleed easily.  Psychiatric/Behavioral: Negative for depression. The patient is not nervous/anxious and does not have insomnia.    Physical Exam   Blood pressure 155/90, pulse 91, temperature 98.7 F (37.1 C), temperature source Oral, resp. rate 18, height 5\' 8"  (1.727 m), weight 360 lb (163.295 kg), last menstrual period 04/21/2012, not currently breastfeeding.  Physical Exam  Constitutional: She is oriented to person, place, and time. She appears well-developed and  well-nourished. No distress.  HENT:  Head: Normocephalic and atraumatic.  Eyes: EOM are normal.  Neck: Neck supple.  Cardiovascular: Normal rate.   Respiratory: Effort normal.  GI: Soft. There is tenderness in the right lower quadrant, suprapubic area and left lower quadrant. There is no rigidity, no rebound, no guarding and no CVA tenderness.  Genitourinary:       External genitalia without lesions. Frothy malodorous discharge vaginal vault. Positive CMT, bilateral adnexal tenderness. Unable to palpate uterus due to patient habitus.  Musculoskeletal: Normal range of motion.  Neurological: She is alert and oriented to person, place, and time.  Skin: Skin is warm and dry.  Psychiatric: She has a normal mood and affect. Her behavior is normal. Judgment and thought content normal.    MAU Course  Procedures  Results for orders placed during the hospital encounter of 05/16/12 (from the past 24 hour(s))  URINALYSIS, ROUTINE W REFLEX MICROSCOPIC     Status: Abnormal   Collection Time   05/16/12  6:11 AM      Component Value Range   Color, Urine YELLOW  YELLOW   APPearance CLEAR  CLEAR   Specific Gravity, Urine 1.025  1.005 - 1.030   pH 6.0  5.0 - 8.0   Glucose, UA NEGATIVE  NEGATIVE mg/dL   Hgb urine dipstick NEGATIVE  NEGATIVE   Bilirubin Urine NEGATIVE  NEGATIVE   Ketones, ur NEGATIVE  NEGATIVE mg/dL   Protein, ur NEGATIVE  NEGATIVE mg/dL   Urobilinogen, UA 2.0 (*) 0.0 - 1.0 mg/dL   Nitrite NEGATIVE  NEGATIVE   Leukocytes, UA NEGATIVE  NEGATIVE  POCT PREGNANCY, URINE     Status: Normal   Collection Time   05/16/12  6:22 AM      Component Value Range   Preg Test, Ur NEGATIVE  NEGATIVE  WET PREP, GENITAL     Status: Abnormal   Collection Time   05/16/12  7:08 AM      Component Value Range   Yeast Wet Prep HPF POC NONE SEEN  NONE SEEN   Trich, Wet Prep NONE SEEN  NONE SEEN   Clue Cells Wet Prep HPF POC MANY (*) NONE SEEN   WBC, Wet Prep HPF POC FEW (*) NONE SEEN  CBC     Status:  Abnormal   Collection Time   05/16/12  7:20 AM      Component Value Range   WBC 6.1  4.0 - 10.5 K/uL   RBC 4.54  3.87 - 5.11 MIL/uL   Hemoglobin 9.4 (*) 12.0 - 15.0 g/dL   HCT 16.1 (*) 09.6 - 04.5 %   MCV 68.1 (*) 78.0 - 100.0 fL   MCH 20.7 (*) 26.0 - 34.0 pg   MCHC 30.4  30.0 -  36.0 g/dL   RDW 47.8 (*) 29.5 - 62.1 %   Platelets 309  150 - 400 K/uL  COMPREHENSIVE METABOLIC PANEL     Status: Normal   Collection Time   05/16/12  7:20 AM      Component Value Range   Sodium 140  135 - 145 mEq/L   Potassium 4.0  3.5 - 5.1 mEq/L   Chloride 104  96 - 112 mEq/L   CO2 26  19 - 32 mEq/L   Glucose, Bld 73  70 - 99 mg/dL   BUN 14  6 - 23 mg/dL   Creatinine, Ser 3.08  0.50 - 1.10 mg/dL   Calcium 9.4  8.4 - 65.7 mg/dL   Total Protein 7.1  6.0 - 8.3 g/dL   Albumin 3.6  3.5 - 5.2 g/dL   AST 17  0 - 37 U/L   ALT 14  0 - 35 U/L   Alkaline Phosphatase 113  39 - 117 U/L   Total Bilirubin 0.4  0.3 - 1.2 mg/dL   GFR calc non Af Amer >90  >90 mL/min   GFR calc Af Amer >90  >90 mL/min   Clinical Data: Pelvic pain.  TRANSABDOMINAL AND TRANSVAGINAL ULTRASOUND OF PELVIS  Technique: Both transabdominal and transvaginal ultrasound  examinations of the pelvis were performed. Transabdominal  technique was performed for global imaging of the pelvis including  uterus, ovaries, adnexal regions, and pelvic cul-de-sac.  It was necessary to proceed with endovaginal exam following the  transabdominal exam to visualize the endometrium and ovaries.  Comparison: None.  Findings:  Uterus: 6.3 x 3.4 x 4.4 cm. No fibroids or other uterine masses  identified.  Endometrium: Double layer thickness measures 6 mm transvaginally.  No focal lesion visualized.  Right ovary: 2.6 x 1.7 x 1.7 cm. Normal appearance.  Left ovary: 3.1 x 1.8 x 1.6 cm. Normal appearance.  Other Findings: No free fluid  IMPRESSION:  Negative. No evidence of pelvic mass or other significant  abnormality.  Original Report Authenticated By: Danae Orleans, M.D.       Assessment: 26 y.o. female with abdominal pain   Bacterial vaginosis  Plan:  Toradol 60 mg IM   Ultrasound BV-presciption for Flagyl 500mg  BID for 7 days  08:25 am Care turned over to Pamelia Hoit, NP, patient ultrasound pending.  NEESE,HOPE, RN, FNP, [facility] 05/16/2012, 6:57 AM

## 2012-05-17 LAB — GC/CHLAMYDIA PROBE AMP, GENITAL: Chlamydia, DNA Probe: NEGATIVE

## 2012-05-20 IMAGING — CT CT ABD-PELV W/ CM
1 series · 15 of 32 positions shown, 19 images · IV contrast (agent unspecified)
Comparison: CT abdomen pelvis of 09/14/2010

CLINICAL DATA: Diffuse abdominal pain, nausea, negative urine
pregnancy test

CT ABDOMEN AND PELVIS WITH CONTRAST
TECHNIQUE: Multidetector CT imaging of the abdomen and pelvis was
performed following the standard protocol during bolus
administration of intravenous contrast.
Contrast: 100 ml Rmnipaque-UJJ

[Series 2: rtn ap with st · axial · 0.85mm/px · z∈[-456,-0]mm · 15 of 102 slices shown, 19 images]
[im 7/102  soft-tissue]
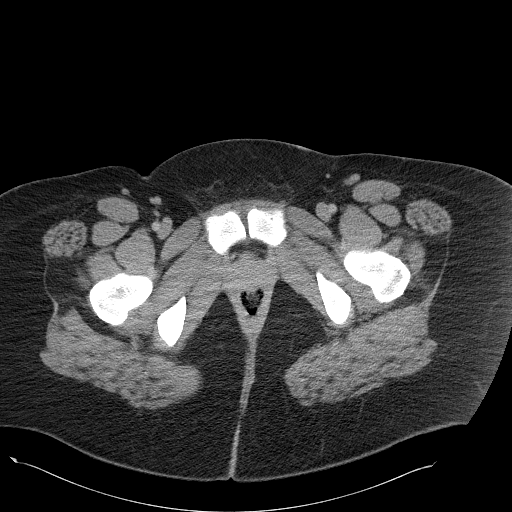
[im 7/102  bone]
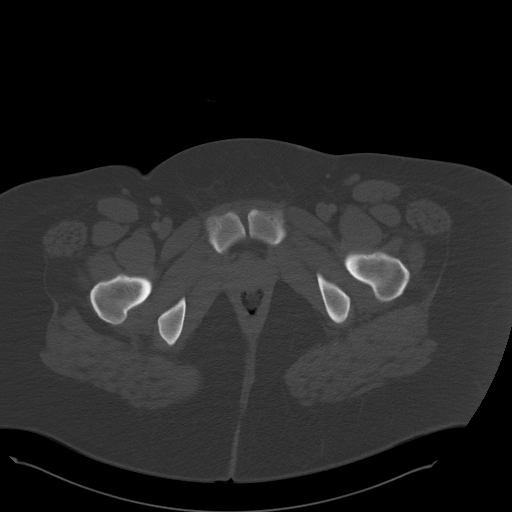
[im 14/102  soft-tissue]
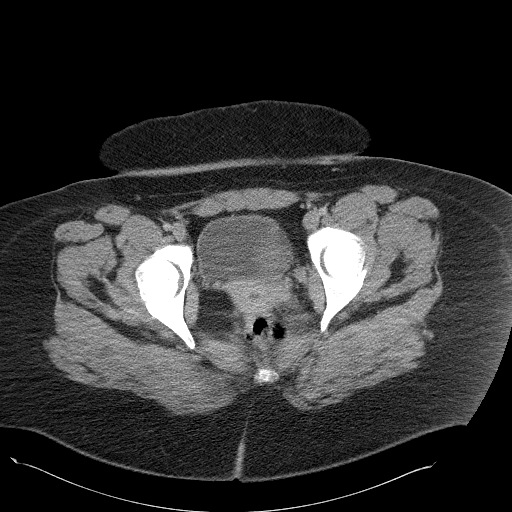
[im 20/102  soft-tissue]
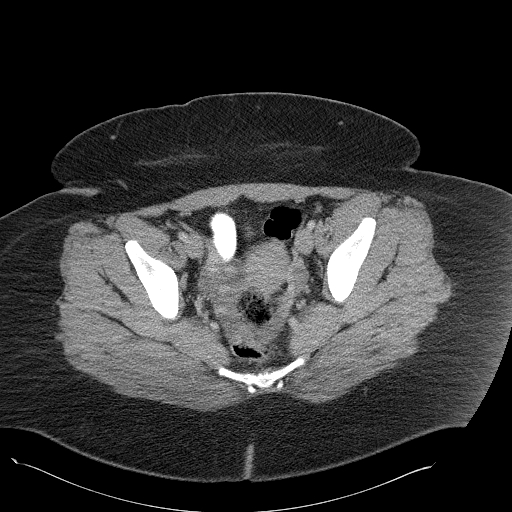
[im 30/102  soft-tissue]
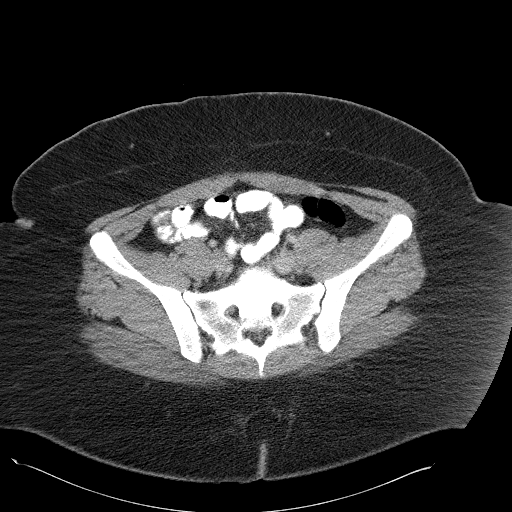
[im 36/102  soft-tissue]
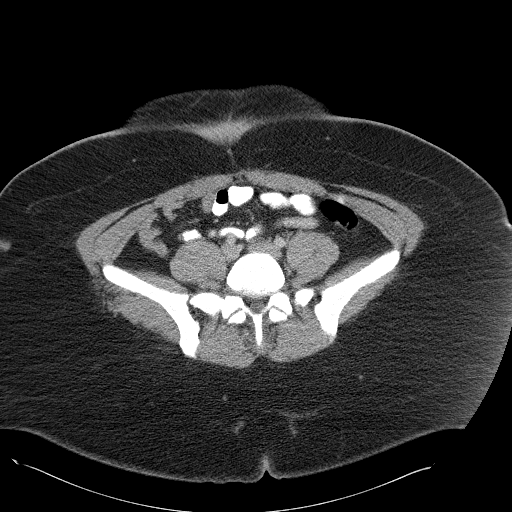
[im 43/102  soft-tissue]
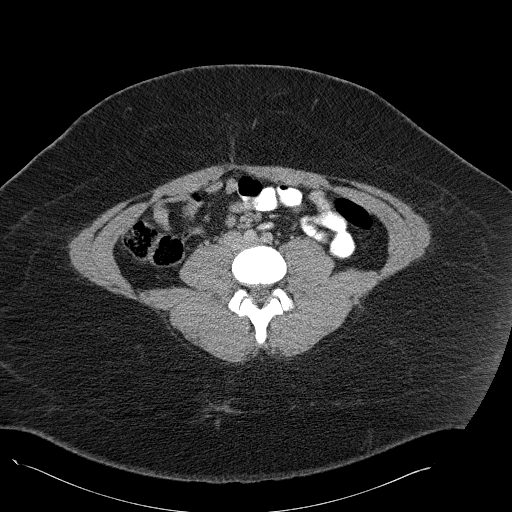
[im 53/102  soft-tissue]
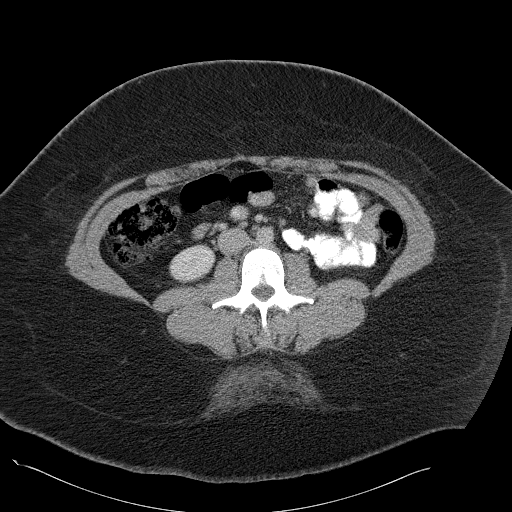
[im 59/102  soft-tissue]
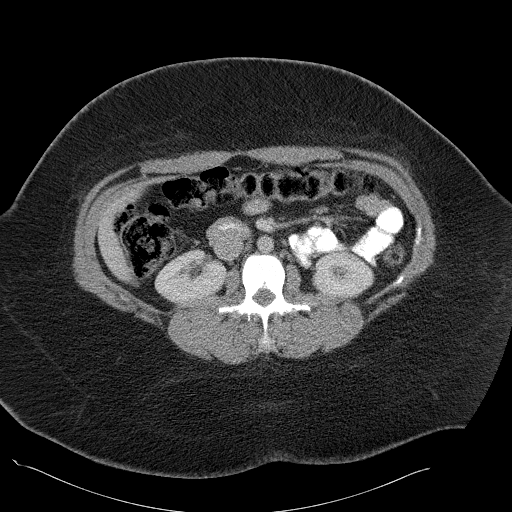
[im 66/102  soft-tissue]
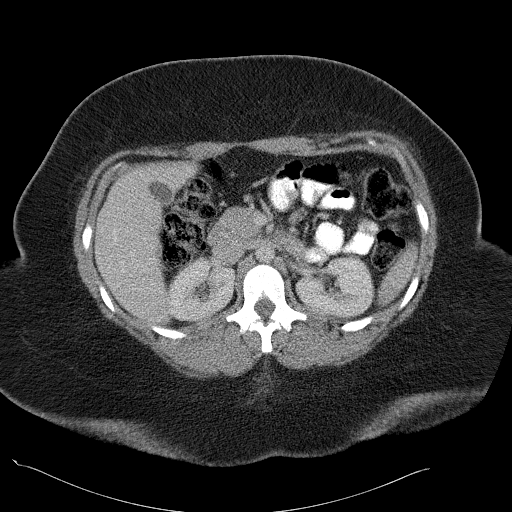
[im 66/102  bone]
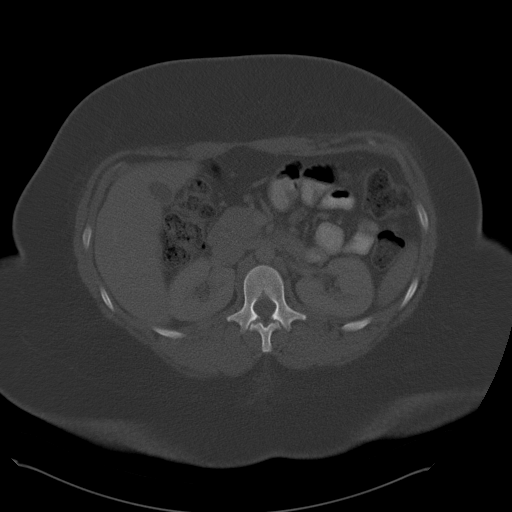
[im 72/102  soft-tissue]
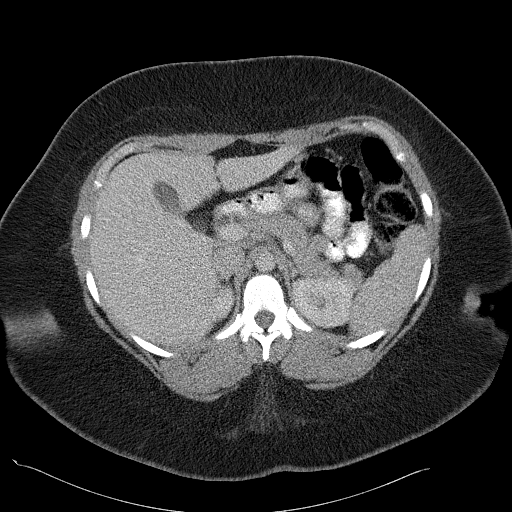
[im 82/102  soft-tissue]
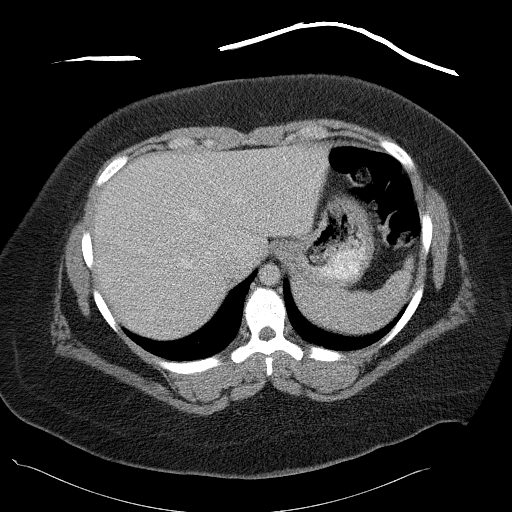
[im 88/102  soft-tissue]
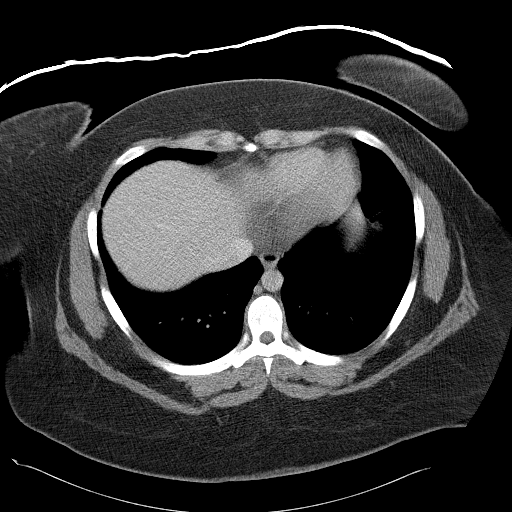
[im 88/102  lung]
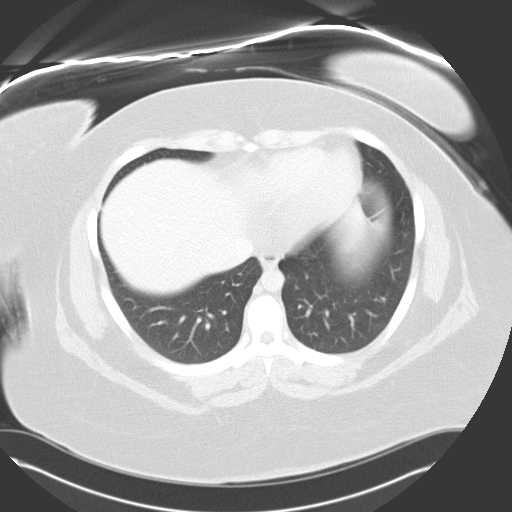
[im 92/102  lung]
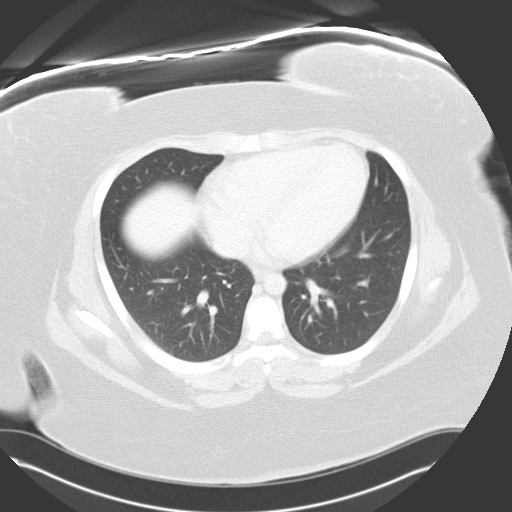
[im 95/102  soft-tissue]
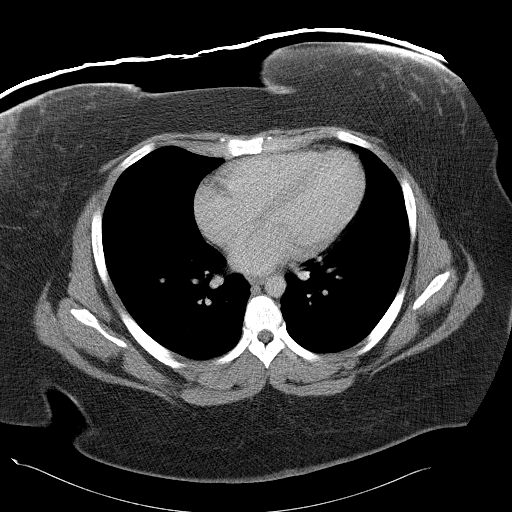
[im 95/102  lung]
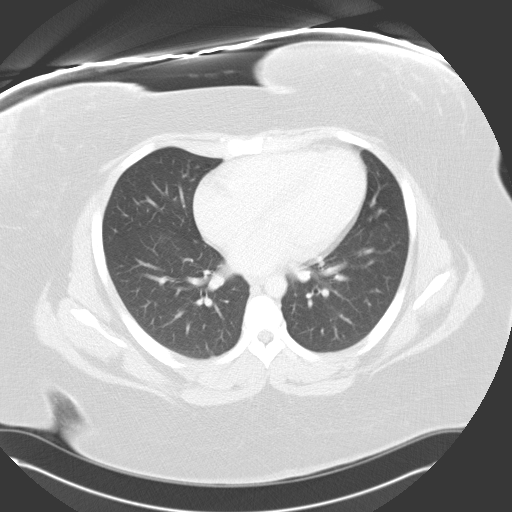
[im 98/102  lung]
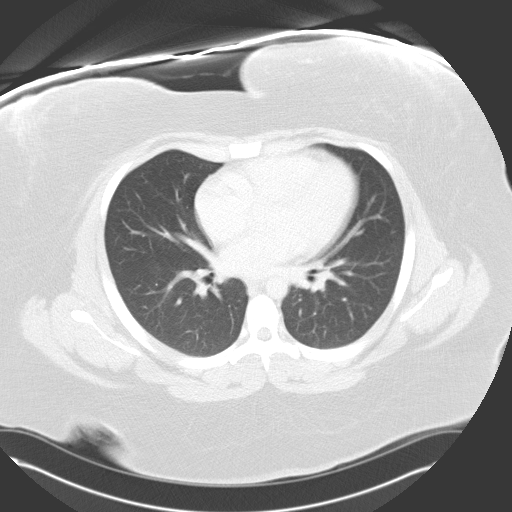

[15 of 32 positions shown; findings below may reference images not displayed]

FINDINGS: The lung bases are clear.  The liver enhances with no
focal abnormality and no ductal dilatation is seen. The liver may
be slightly low in attenuation, and fatty infiltration is a
consideration.  No calcified gallstones are noted and the
gallbladder wall is not thickened.  The pancreas is normal in size
and the pancreatic duct is not dilated.  The adrenal glands and
spleen are unremarkable.  The stomach is not well distended.  The
kidneys enhance with no calculus or mass and no hydronephrosis is
seen.  The abdominal aorta is normal in caliber.  No adenopathy is
seen.

The uterus is normal in size with slightly prominent endometrium.
There does appear to be a moderate amount of free fluid in the
pelvis which may be due to a recently ruptured ovarian cyst.  Small
ovarian follicles are noted bilaterally.  The urinary bladder is
unremarkable.  No abnormality of the colon is seen.  The appendix
is well visualized within the right lower quadrant filling
partially with air.  There is no evidence of appendicitis.  The
terminal ileum is unremarkable as well.  No dilatation of small
bowel is seen. No bony abnormality is noted.
IMPRESSION: 1.  There is a moderate amount of free fluid in the pelvis which
may be due to a recently ruptured ovarian cyst.
2.  The appendix and terminal ileum appear normal.
3.  Question fatty infiltration of the liver.

## 2012-05-22 NOTE — MAU Provider Note (Signed)
Chart reviewed and agree with management and plan.  

## 2012-06-27 ENCOUNTER — Other Ambulatory Visit: Payer: Self-pay

## 2012-08-08 ENCOUNTER — Emergency Department (HOSPITAL_COMMUNITY)
Admission: EM | Admit: 2012-08-08 | Discharge: 2012-08-08 | Disposition: A | Discharge status: Home / Self Care | Payer: Self-pay | Attending: Emergency Medicine | Admitting: Emergency Medicine

## 2012-08-08 ENCOUNTER — Encounter (HOSPITAL_COMMUNITY): Payer: Self-pay | Admitting: Emergency Medicine

## 2012-08-08 DIAGNOSIS — M545 Low back pain, unspecified: Secondary | ICD-10-CM | POA: Insufficient documentation

## 2012-08-08 DIAGNOSIS — M79609 Pain in unspecified limb: Secondary | ICD-10-CM | POA: Insufficient documentation

## 2012-08-08 DIAGNOSIS — J45909 Unspecified asthma, uncomplicated: Secondary | ICD-10-CM | POA: Insufficient documentation

## 2012-08-08 DIAGNOSIS — M5442 Lumbago with sciatica, left side: Secondary | ICD-10-CM

## 2012-08-08 DIAGNOSIS — A6000 Herpesviral infection of urogenital system, unspecified: Secondary | ICD-10-CM | POA: Insufficient documentation

## 2012-08-08 DIAGNOSIS — Z8619 Personal history of other infectious and parasitic diseases: Secondary | ICD-10-CM | POA: Insufficient documentation

## 2012-08-08 DIAGNOSIS — Z79899 Other long term (current) drug therapy: Secondary | ICD-10-CM | POA: Insufficient documentation

## 2012-08-08 DIAGNOSIS — Z889 Allergy status to unspecified drugs, medicaments and biological substances status: Secondary | ICD-10-CM | POA: Insufficient documentation

## 2012-08-08 DIAGNOSIS — Z7982 Long term (current) use of aspirin: Secondary | ICD-10-CM | POA: Insufficient documentation

## 2012-08-08 DIAGNOSIS — I10 Essential (primary) hypertension: Secondary | ICD-10-CM | POA: Insufficient documentation

## 2012-08-08 DIAGNOSIS — M5430 Sciatica, unspecified side: Secondary | ICD-10-CM | POA: Insufficient documentation

## 2012-08-08 MED ORDER — METHOCARBAMOL 500 MG PO TABS: 500.0000 mg | ORAL_TABLET | Freq: Two times a day (BID) | ORAL | Status: DC | PRN

## 2012-08-08 MED ORDER — OXYCODONE-ACETAMINOPHEN 5-325 MG PO TABS: 1.0000 | ORAL_TABLET | Freq: Four times a day (QID) | ORAL | Status: DC | PRN

## 2012-08-08 MED ORDER — IBUPROFEN 800 MG PO TABS: 800.0000 mg | ORAL_TABLET | Freq: Three times a day (TID) | ORAL | Status: DC

## 2012-08-08 NOTE — ED Notes (Signed)
Pt c/o lower back pain with radiation down left leg x 2 days; pt denies obvious injury

## 2012-08-08 NOTE — ED Notes (Signed)
Pr alert no distress

## 2012-08-08 NOTE — ED Provider Notes (Signed)
History   This chart was scribed for Tobin Chad, MD by Albertha Ghee Rifaie. This patient was seen in room TR09C/TR09C and the patient's care was started at 6:31 PM.   CSN: [id number]  Arrival date & time 08/08/12  1652   None     Chief Complaint  Patient presents with  . Back Pain  . Leg Pain     The history is provided by the patient. No language interpreter was used.    [name] is a 26 y.o. female who presents to the Emergency Department complaining of 2 days of lower back pain that radiates to left leg. Pt denies any new injuries. She denies any noted swelling of the leg. Pain is aggravated with walking. She denies urinary problems, change in BM function, fever, and chills as associated symptoms. She works at a [employer] that doesn't require heavy lifting. She denies having any chronic health problems. no other symptoms. Pt denies smoking and alcohol use.    Past Medical History  Diagnosis Date  . Polycystic ovarian syndrome   . Asthma   . Hypertension   . DUB (dysfunctional uterine bleeding)   . Morbid obesity   . Gonorrhea June 2013  . Genital HSV     Past Surgical History  Procedure Date  . No past surgeries   . Dilation and curettage of uterus 08/15/2011    Procedure: DILATATION AND CURETTAGE (D&C);  Surgeon: Leanora Ivanoff. Marice Potter, MD;  Location: [facility];  Service: Gynecology;  Laterality: N/A;    Family History  Problem Relation Age of Onset  . Cancer Mother     ovarian  . Diabetes Mother   . Asthma Mother   . Diabetes Father   . Asthma Father   . Asthma Sister   . Asthma Brother     History  Substance Use Topics  . Smoking status: Never Smoker   . Smokeless tobacco: Never Used  . Alcohol Use: No    OB History    Grav Para Term Preterm Abortions TAB SAB Ect Mult Living   1 0 0 0 1 0 1 0 0 0       Review of Systems  Constitutional: Negative.   HENT: Negative.   Eyes: Negative.   Respiratory: Negative.   Cardiovascular: Negative.     Gastrointestinal: Negative.   Genitourinary: Negative.   Musculoskeletal: Positive for back pain.  Skin: Negative.   Neurological: Negative.   Hematological: Negative.   Psychiatric/Behavioral: Negative.   All other systems reviewed and are negative.    Allergies  Latex  Home Medications   Current Outpatient Rx  Name Route Sig Dispense Refill  . ALBUTEROL SULFATE HFA 108 (90 BASE) MCG/ACT IN AERS Inhalation Inhale 2 puffs into the lungs every 6 (six) hours as needed. For SOB    . ASPIRIN 325 MG PO TABS Oral Take 325 mg by mouth daily.     Marland Kitchen VALACYCLOVIR HCL 500 MG PO TABS Oral Take 500 mg by mouth daily.      BP 159/75  Pulse 66  Temp 98.5 F (36.9 C) (Oral)  Resp 18  SpO2 99%  Breastfeeding? No  Physical Exam  Nursing note and vitals reviewed. Constitutional: She is oriented to person, place, and time. She appears well-developed and well-nourished. No distress.  HENT:  Head: Normocephalic and atraumatic.  Right Ear: External ear normal.  Left Ear: External ear normal.  Mouth/Throat: Oropharynx is clear and moist. No oropharyngeal exudate.  Eyes: EOM are  normal. Pupils are equal, round, and reactive to light. Right eye exhibits no discharge. Left eye exhibits no discharge. No scleral icterus.  Neck: Normal range of motion. Neck supple. No JVD present. No tracheal deviation present. No thyromegaly present.  Cardiovascular: Normal rate, regular rhythm, normal heart sounds and intact distal pulses.  Exam reveals no gallop and no friction rub.   No murmur heard. Pulmonary/Chest: Effort normal and breath sounds normal. No stridor. No respiratory distress. She has no wheezes. She has no rales. She exhibits no tenderness.  Abdominal: Soft. Bowel sounds are normal. She exhibits no distension and no mass. There is no tenderness. There is no rebound and no guarding.       Obese, protuberant  Genitourinary: Vagina normal and uterus normal. Guaiac negative stool. No vaginal  discharge found.  Musculoskeletal: She exhibits tenderness. She exhibits no edema.       Lumbar back: She exhibits decreased range of motion, tenderness and spasm. She exhibits no bony tenderness, no swelling, no edema, no deformity, no laceration, no pain and normal pulse.       Negative bilateral straight leg raise.  No calf tenderness or asymmetry.  Lymphadenopathy:    She has no cervical adenopathy.  Neurological: She is alert and oriented to person, place, and time. She has normal reflexes. She displays no tremor and normal reflexes. No cranial nerve deficit. She exhibits normal muscle tone. Coordination normal. GCS eye subscore is 4. GCS verbal subscore is 5. GCS motor subscore is 6. She displays no Babinski's sign on the right side. She displays no Babinski's sign on the left side.  Skin: Skin is warm and dry. No rash noted. She is not diaphoretic. No erythema. No pallor.  Psychiatric: She has a normal mood and affect. Her behavior is normal. Judgment and thought content normal.    ED Course  Procedures (including critical care time)  Labs Reviewed - No data to display No results found.  DIAGNOSTIC STUDIES: Oxygen Saturation is 99% on room air, normal by my interpretation.    COORDINATION OF CARE: 6:35 PM Discussed treatment plan with pt at bedside and pt agreed to plan.     No diagnosis found.    MDM  Pt presents for evaluation of lower back pain that radiates in to the left leg.  She has no specific point tenderness on exam but diffuse lumbar and sacral tenderness.  She has no focal lower extremity neuro deficits.  She has no swelling of the leg or clinical evidence of DVT.  Plan symptomatic care.  Encouraged outpt follow-up.     I personally performed the services described in this documentation, which was scribed in my presence. The recorded information has been reviewed and considered.     Tobin Chad, MD 08/08/12 2036

## 2012-09-27 ENCOUNTER — Encounter: Payer: Self-pay | Admitting: Obstetrics & Gynecology

## 2012-09-27 ENCOUNTER — Ambulatory Visit (INDEPENDENT_AMBULATORY_CARE_PROVIDER_SITE_OTHER): Payer: Self-pay | Admitting: Obstetrics & Gynecology

## 2012-09-27 VITALS — BP 104/66 | HR 81 | Temp 98.3°F | Ht 68.0 in | Wt 356.2 lb

## 2012-09-27 DIAGNOSIS — N920 Excessive and frequent menstruation with regular cycle: Secondary | ICD-10-CM

## 2012-09-27 DIAGNOSIS — E282 Polycystic ovarian syndrome: Secondary | ICD-10-CM

## 2012-09-27 LAB — POCT PREGNANCY, URINE: Preg Test, Ur: NEGATIVE

## 2012-09-27 LAB — CBC
MCH: 21.4 pg — ABNORMAL LOW (ref 26.0–34.0)
MCHC: 31.4 g/dL (ref 30.0–36.0)
MCV: 68.3 fL — ABNORMAL LOW (ref 78.0–100.0)
Platelets: 389 10*3/uL (ref 150–400)
RDW: 17.7 % — ABNORMAL HIGH (ref 11.5–15.5)

## 2012-09-27 NOTE — Patient Instructions (Addendum)
Exercise to Lose Weight Exercise and a healthy diet may help you lose weight. Your doctor may suggest specific exercises. EXERCISE IDEAS AND TIPS  Choose low-cost things you enjoy doing, such as walking, bicycling, or exercising to workout videos.  Take stairs instead of the elevator.  Walk during your lunch break.  Park your car further away from work or school.  Go to a gym or an exercise class.  Start with 5 to 10 minutes of exercise each day. Build up to 30 minutes of exercise 4 to 6 days a week.  Wear shoes with good support and comfortable clothes.  Stretch before and after working out.  Work out until you breathe harder and your heart beats faster.  Drink extra water when you exercise.  Do not do so much that you hurt yourself, feel dizzy, or get very short of breath. Exercises that burn about 150 calories:  Running 1  miles in 15 minutes.  Playing volleyball for 45 to 60 minutes.  Washing and waxing a car for 45 to 60 minutes.  Playing touch football for 45 minutes.  Walking 1  miles in 35 minutes.  Pushing a stroller 1  miles in 30 minutes.  Playing basketball for 30 minutes.  Raking leaves for 30 minutes.  Bicycling 5 miles in 30 minutes.  Walking 2 miles in 30 minutes.  Dancing for 30 minutes.  Shoveling snow for 15 minutes.  Swimming laps for 20 minutes.  Walking up stairs for 15 minutes.  Bicycling 4 miles in 15 minutes.  Gardening for 30 to 45 minutes.  Jumping rope for 15 minutes.  Washing windows or floors for 45 to 60 minutes. Document Released: 10/29/2010 Document Revised: 12/19/2011 Document Reviewed: 10/29/2010 [facility] Patient Information 2013 [place], [place]. Polycystic Ovarian Syndrome Polycystic ovarian syndrome is a condition with a number of problems. One problem is with the ovaries. The ovaries are organs located in the female pelvis, on each side of the uterus. Usually, during the menstrual cycle, an egg is released from  1 ovary every month. This is called ovulation. When the egg is fertilized, it goes into the womb (uterus), which allows for the growth of a baby. The egg travels from the ovary through the fallopian tube to the uterus. The ovaries also make the hormones estrogen and progesterone. These hormones help the development of a woman's breasts, body shape, and body hair. They also regulate the menstrual cycle and pregnancy. Sometimes, cysts form in the ovaries. A cyst is a fluid-filled sac. On the ovary, different types of cysts can form. The most common type of ovarian cyst is called a functional or ovulation cyst. It is normal, and often forms during the normal menstrual cycle. Each month, a woman's ovaries grow tiny cysts that hold the eggs. When an egg is fully grown, the sac breaks open. This releases the egg. Then, the sac which released the egg from the ovary dissolves. In one type of functional cyst, called a follicle cyst, the sac does not break open to release the egg. It may actually continue to grow. This type of cyst usually disappears within 1 to 3 months.  One type of cyst problem with the ovaries is called Polycystic Ovarian Syndrome (PCOS). In this condition, many follicle cysts form, but do not rupture and produce an egg. This health problem can affect the following:  Menstrual cycle.  Heart.  Obesity.  Cancer of the uterus.  Fertility.  Blood vessels.  Hair growth (face and body) or  baldness.  Hormones.  Appearance.  High blood pressure.  Stroke.  Insulin production.  Inflammation of the liver.  Elevated blood cholesterol and triglycerides. CAUSES   No one knows the exact cause of PCOS.  Women with PCOS often have a mother or sister with PCOS. There is not yet enough proof to say this is inherited.  Many women with PCOS have a weight problem.  Researchers are looking at the relationship between PCOS and the body's ability to make insulin. Insulin is a hormone that  regulates the change of sugar, starches, and other food into energy for the body's use, or for storage. Some women with PCOS make too much insulin. It is possible that the ovaries react by making too many female hormones, called androgens. This can lead to acne, excessive hair growth, weight gain, and ovulation problems.  Too much production of luteinizing hormone (LH) from the pituitary gland in the brain stimulates the ovary to produce too much female hormone (androgen). SYMPTOMS   Infrequent or no menstrual periods, and/or irregular bleeding.  Inability to get pregnant (infertility), because of not ovulating.  Increased growth of hair on the face, chest, stomach, back, thumbs, thighs, or toes.  Acne, oily skin, or dandruff.  Pelvic pain.  Weight gain or obesity, usually carrying extra weight around the waist.  Type 2 diabetes (this is the diabetes that usually does not need insulin).  High cholesterol.  High blood pressure.  Female-pattern baldness or thinning hair.  Patches of thickened and dark brown or black skin on the neck, arms, breasts, or thighs.  Skin tags, or tiny excess flaps of skin, in the armpits or neck area.  Sleep apnea (excessive snoring and breathing stops at times while asleep).  Deepening of the voice.  Gestational diabetes when pregnant.  Increased risk of miscarriage with pregnancy. DIAGNOSIS  There is no single test to diagnose PCOS.   Your caregiver will:  Take a medical history.  Perform a pelvic exam.  Perform an ultrasound.  Check your female and female hormone levels.  Measure glucose or sugar levels in the blood.  Do other blood tests.  If you are producing too many female hormones, your caregiver will make sure it is from PCOS. At the physical exam, your caregiver will want to evaluate the areas of increased hair growth. Try to allow natural hair growth for a few days before the visit.  During a pelvic exam, the ovaries may be enlarged or  swollen by the increased number of small cysts. This can be seen more easily by vaginal ultrasound or screening, to examine the ovaries and lining of the uterus (endometrium) for cysts. The uterine lining may become thicker, if there has not been a regular period. TREATMENT  Because there is no cure for PCOS, it needs to be managed to prevent problems. Treatments are based on your symptoms. Treatment is also based on whether you want to have a baby or whether you need contraception.  Treatment may include:  Progesterone hormone, to start a menstrual period.  Birth control pills, to make you have regular menstrual periods.  Medicines to make you ovulate, if you want to get pregnant.  Medicines to control your insulin.  Medicine to control your blood pressure.  Medicine and diet, to control your high cholesterol and triglycerides in your blood.  Surgery, making small holes in the ovary, to decrease the amount of female hormone production. This is done through a long, lighted tube (laparoscope), placed into the pelvis  through a tiny incision in the lower abdomen. Your caregiver will go over some of the choices with you. WOMEN WITH PCOS HAVE THESE CHARACTERISTICS:  High levels of female hormones called androgens.  An irregular or no menstrual cycle.  May have many small cysts in their ovaries. PCOS is the most common hormonal reproductive problem in women of childbearing age. WHY DO WOMEN WITH PCOS HAVE TROUBLE WITH THEIR MENSTRUAL CYCLE? Each month, about 20 eggs start to mature in the ovaries. As one egg grows and matures, the follicle breaks open to release the egg, so it can travel through the fallopian tube for fertilization. When the single egg leaves the follicle, ovulation takes place. In women with PCOS, the ovary does not make all of the hormones it needs for any of the eggs to fully mature. They may start to grow and accumulate fluid, but no one egg becomes large enough. Instead, some  may remain as cysts. Since no egg matures or is released, ovulation does not occur and the hormone progesterone is not made. Without progesterone, a woman's menstrual cycle is irregular or absent. Also, the cysts produce female hormones, which continue to prevent ovulation.  Document Released: 01/20/2005 Document Revised: 12/19/2011 Document Reviewed: 08/14/2009 [facility] Patient Information 2013 [place], [place].

## 2012-09-28 MED ORDER — MEGESTROL ACETATE 20 MG PO TABS: 20.0000 mg | ORAL_TABLET | Freq: Every day | ORAL | Status: DC

## 2012-09-28 MED ORDER — METFORMIN HCL 500 MG PO TABS: 1000.0000 mg | ORAL_TABLET | Freq: Two times a day (BID) | ORAL | Status: DC

## 2012-09-28 NOTE — Progress Notes (Addendum)
Subjective:     Patient ID: [name], female   DOB: [birth year], 26 y.o.   MRN: [id number]  HPI [name] is a 26 y.o. G2 P48 female who presents to clinic for evaluation of bleeding x 3 weeks. The patient states that she has been having light bleeding for the past 2 1/2 - 3 weeks with lower abdominal cramping. The patient has an extensive history of irregular bleeding that has been treated many ways in the past. The patient was diagnosed with PCOS and given Depo provera in the clinic in October of 2012. When this did not stop her bleeding she then had a D&C in November 2012. She did not have any bleeding for until approximately July of this year following the procedure and states that she has had normal regular periods since then until this month. The patient has been offered infertility appointments to address her issues and desire for pregnancy, however her and her husband have decided against this due to financial obligation.   The patient also endorses N/V for the last 4 days. She has not had fever, weakness, dizziness or recent sick contacts. She has had a mild headache for which she has taken aspirin.   Her most recent US was normal in August 2013. Pap smear up to date. Last obtained 02/2012  Review of Systems All negative unless otherwise noted in HPI    Objective:   Physical Exam BP 104/66  Pulse 81  Temp 98.3 F (36.8 C) (Oral)  Ht 5\' 8"  (1.727 m)  Wt 356 lb 3.2 oz (161.571 kg)  BMI 54.16 kg/m2  LMP 08/21/2012  Breastfeeding? No GENERAL: Well-developed, morbidly obese female in no acute distress.  HEENT: Normocephalic, atraumatic. Sclerae anicteric.   LUNGS: Normal respiratory effort HEART: Regular rate. EXTREMITIES: No cyanosis, clubbing, or edema.     Assessment:     1. Irregular bleeding 2. PCOS     Plan:     CBC, Hgb A1c, TSH obtained today UPT negative Megace 20 mg daily x 4 weeks Metformin gradual taper to 1000 mg BID Patient to restart  previous iron supplement and contact clinic if she needs a new Rx Discussed at length the benefits of weight loss for bleeding, fertility and overall health Patient will return to clinic in 1 month for follow-up   30 minutes with patient discussing treatment plan     Attestation of Attending Supervision of Advanced Practitioner (CNM/NP): Evaluation and management procedures were performed by the Advanced Practitioner under my supervision and collaboration.  I have reviewed the Advanced Practitioner's note and chart, and I agree with the management and plan.  HARRAWAY-SMITH, CAROLYN 9:35 AM

## 2012-11-03 ENCOUNTER — Inpatient Hospital Stay (HOSPITAL_COMMUNITY)
Admission: AD | Admit: 2012-11-03 | Discharge: 2012-11-03 | Disposition: A | Discharge status: Home / Self Care | Payer: 59 | Source: Ambulatory Visit | Attending: Obstetrics & Gynecology | Admitting: Obstetrics & Gynecology

## 2012-11-03 ENCOUNTER — Encounter (HOSPITAL_COMMUNITY): Payer: Self-pay | Admitting: *Deleted

## 2012-11-03 DIAGNOSIS — N938 Other specified abnormal uterine and vaginal bleeding: Secondary | ICD-10-CM | POA: Insufficient documentation

## 2012-11-03 DIAGNOSIS — N949 Unspecified condition associated with female genital organs and menstrual cycle: Secondary | ICD-10-CM | POA: Insufficient documentation

## 2012-11-03 DIAGNOSIS — N946 Dysmenorrhea, unspecified: Secondary | ICD-10-CM | POA: Insufficient documentation

## 2012-11-03 DIAGNOSIS — N920 Excessive and frequent menstruation with regular cycle: Secondary | ICD-10-CM

## 2012-11-03 LAB — CBC
HCT: 33.9 % — ABNORMAL LOW (ref 36.0–46.0)
Hemoglobin: 10.5 g/dL — ABNORMAL LOW (ref 12.0–15.0)
MCH: 21.6 pg — ABNORMAL LOW (ref 26.0–34.0)
MCHC: 31 g/dL (ref 30.0–36.0)
MCV: 69.8 fL — ABNORMAL LOW (ref 78.0–100.0)

## 2012-11-03 LAB — ABO/RH: ABO/RH(D): AB POS

## 2012-11-03 LAB — TYPE AND SCREEN: ABO/RH(D): AB POS

## 2012-11-03 MED ORDER — MEGESTROL ACETATE 20 MG PO TABS: 20.0000 mg | ORAL_TABLET | Freq: Every day | ORAL | Status: DC

## 2012-11-03 MED ORDER — KETOROLAC TROMETHAMINE 60 MG/2ML IM SOLN
60.0000 mg | Freq: Once | INTRAMUSCULAR | Status: AC
  Administered 2012-11-03: 60 mg via INTRAMUSCULAR
  Filled 2012-11-03: qty 2

## 2012-11-03 MED ORDER — DICLOFENAC SODIUM 75 MG PO TBEC: 75.0000 mg | DELAYED_RELEASE_TABLET | Freq: Two times a day (BID) | ORAL | Status: DC

## 2012-11-03 NOTE — MAU Provider Note (Signed)
Chief Complaint: No chief complaint on file.   First Provider Initiated Contact with Patient 11/03/12 [phone]     SUBJECTIVE HPI: [name] is a 27 y.o. G1P0010 non-pregnant female who presents with vaginal bleeding since 09/30/12 that has gotten much heavier + clots over the past few days. Also reports moderate cramping x several days only when passing clots. Is a pt of [facility]. Hx of menometrorrhagia Tx w/ Depo and Megace in the past. Started Megace 20 mg QD and Metformin 1000 mg BID after appt 09/27/12. Stopped bleeding x 3 days and bled ever since. Has next appt 11/05/12, but bleeding is so heavy that she could wait. Reports feeling dizzy and tired. Denies syncope or and other Hx of severe bleeding. Last [place] 8/13 normal. Pt interested in [facility]. Had one in 2012, good results.   Past Medical History  Diagnosis Date  . Polycystic ovarian syndrome   . Asthma   . Hypertension   . DUB (dysfunctional uterine bleeding)   . Morbid obesity   . Gonorrhea June 2013  . Genital HSV    OB History    Grav Para Term Preterm Abortions TAB SAB Ect Mult Living   1 0 0 0 1 0 1 0 0 0      # Outc Date GA Lbr Len/2nd Wgt Sex Del Anes PTL Lv   1 SAB 11/12           Comments: had D&C     Past Surgical History  Procedure Date  . No past surgeries   . Dilation and curettage of uterus 08/15/2011    Procedure: DILATATION AND CURETTAGE (D&C);  Surgeon: Leanora Ivanoff. Marice Potter, MD;  Location: [facility];  Service: Gynecology;  Laterality: N/A;   History   Social History  . Marital Status: Married    Spouse Name: N/A    Number of Children: N/A  . Years of Education: N/A   Occupational History  . Not on file.   Social History Main Topics  . Smoking status: Never Smoker   . Smokeless tobacco: Never Used  . Alcohol Use: No  . Drug Use: No  . Sexually Active: Yes -- Female partner(s)    Birth Control/ Protection: None     Comment: LAST SEX  THIS MTH   Other Topics Concern  . Not on file   Social History Narrative     ** Merged History Encounter **    No current facility-administered medications on file prior to encounter.   Current Outpatient Prescriptions on File Prior to Encounter  Medication Sig Dispense Refill  . albuterol (PROVENTIL HFA;VENTOLIN HFA) 108 (90 BASE) MCG/ACT inhaler Inhale 2 puffs into the lungs every 6 (six) hours as needed. For SOB      . metFORMIN (GLUCOPHAGE) 500 MG tablet Take 2 tablets (1,000 mg total) by mouth 2 (two) times daily with a meal.  120 tablet  2  . valACYclovir (VALTREX) 500 MG tablet Take 500 mg by mouth daily.       Allergies  Allergen Reactions  . Latex Hives    ROS: Pertinent items in HPI  OBJECTIVE Patient Vitals for the past 24 hrs:  BP Pulse  11/03/12 0618 138/83 mmHg 86   11/03/12 0615 139/67 mmHg 76   11/03/12 0612 135/66 mmHg 76    GENERAL: Well-developed, well-nourished female in no acute distress.  HEENT: Normocephalic HEART: normal rate RESP: normal effort ABDOMEN: Soft, non-tender EXTREMITIES: Nontender, no edema NEURO: Alert and oriented SPECULUM EXAM: NEFG, Small  amount of bright red blood in vault. 1x4 cm fragment of tissue vs clot removed from cervix. cervix clean.  BIMANUAL: cervix closed;UTA uterine size due to body habitus, generalized tenderness of pelvic exam. No adnexal masses.  LAB RESULTS Results for orders placed during the hospital encounter of 11/03/12 (from the past 24 hour(s))  POCT PREGNANCY, URINE     Status: Normal   Collection Time   11/03/12  5:58 AM      Component Value Range   Preg Test, Ur NEGATIVE  NEGATIVE  CBC     Status: Abnormal   Collection Time   11/03/12  6:30 AM      Component Value Range   WBC 8.4  4.0 - 10.5 K/uL   RBC 4.86  3.87 - 5.11 MIL/uL   Hemoglobin 10.5 (*) 12.0 - 15.0 g/dL   HCT 16.1 (*) 09.6 - 04.5 %   MCV 69.8 (*) 78.0 - 100.0 fL   MCH 21.6 (*) 26.0 - 34.0 pg   MCHC 31.0  30.0 - 36.0 g/dL   RDW 40.9 (*) 81.1 - 91.4 %   Platelets 309  150 - 400 K/uL    IMAGING No results  found.  MAU COURSE  ASSESSMENT 1. DUB (dysfunctional uterine bleeding)   2. Dysmenorrhea   3. Menorrhagia    PLAN Discharge home Increase Megace (see below)  Bleeding precautions.     Follow-up Information    Follow up with [facility]. On 11/05/2012.   Contact information:   [address] [place] [address] [phone]      Follow up with [facility] [place] [facility] ADMISS[facility]. (for severe bleeding)    Contact information:   [address] [id number] mc [place] [address] [phone]          Medication List     As of 11/03/2012  7:28 AM    STOP taking these medications         aspirin 325 MG tablet      ibuprofen 800 MG tablet   Commonly known as: ADVIL,MOTRIN      oxyCODONE-acetaminophen 5-325 MG per tablet   Commonly known as: PERCOCET/ROXICET      TAKE these medications         albuterol 108 (90 BASE) MCG/ACT inhaler   Commonly known as: PROVENTIL HFA;VENTOLIN HFA   Inhale 2 puffs into the lungs every 6 (six) hours as needed. For SOB      diclofenac 75 MG EC tablet   Commonly known as: VOLTAREN   Take 1 tablet (75 mg total) by mouth 2 (two) times daily.      megestrol 20 MG tablet   Commonly known as: MEGACE   Take 1 tablet (20 mg total) by mouth daily. Take two tablets (40 mg) three times per day time three days,   then take two tablets (40 mg) two times per day time three days,   then take two tablets (40 mg)once per day      metFORMIN 500 MG tablet   Commonly known as: GLUCOPHAGE   Take 2 tablets (1,000 mg total) by mouth 2 (two) times daily with a meal.      valACYclovir 500 MG tablet   Commonly known as: VALTREX   Take 500 mg by mouth daily.         [place], CNM 11/03/2012  7:28 AM

## 2012-11-03 NOTE — Progress Notes (Signed)
Small ? Piece tissue sent to pathology.

## 2012-11-03 NOTE — MAU Note (Signed)
PT SAYS SHE WENT TO GYN CLINIC ON 1-16 - GAVE HER MED TO STOP BLEEDING.  THEN   STARTED BLEEDING BAD  ON 1-20.   WORE TAMPON  TO HOSPITAL- PT WAS AT WORK TONIGHT.   CRAMPS STARTED ON 10-22-2012-   TOOK OTC -  TYLENOL- NO RELIEF.- PUTS HER TO SLEEP.

## 2012-11-12 ENCOUNTER — Ambulatory Visit: Payer: Self-pay | Admitting: Obstetrics and Gynecology

## 2012-11-27 ENCOUNTER — Emergency Department (HOSPITAL_COMMUNITY)
Admission: EM | Admit: 2012-11-27 | Discharge: 2012-11-27 | Disposition: A | Discharge status: Home / Self Care | Payer: Self-pay | Attending: Emergency Medicine | Admitting: Emergency Medicine

## 2012-11-27 ENCOUNTER — Emergency Department (HOSPITAL_COMMUNITY): Payer: Self-pay

## 2012-11-27 ENCOUNTER — Encounter (HOSPITAL_COMMUNITY): Payer: Self-pay | Admitting: *Deleted

## 2012-11-27 DIAGNOSIS — Z8742 Personal history of other diseases of the female genital tract: Secondary | ICD-10-CM | POA: Insufficient documentation

## 2012-11-27 DIAGNOSIS — M79676 Pain in unspecified toe(s): Secondary | ICD-10-CM

## 2012-11-27 DIAGNOSIS — Y9289 Other specified places as the place of occurrence of the external cause: Secondary | ICD-10-CM | POA: Insufficient documentation

## 2012-11-27 DIAGNOSIS — J45909 Unspecified asthma, uncomplicated: Secondary | ICD-10-CM | POA: Insufficient documentation

## 2012-11-27 DIAGNOSIS — E669 Obesity, unspecified: Secondary | ICD-10-CM | POA: Insufficient documentation

## 2012-11-27 DIAGNOSIS — Z87448 Personal history of other diseases of urinary system: Secondary | ICD-10-CM | POA: Insufficient documentation

## 2012-11-27 DIAGNOSIS — S8990XA Unspecified injury of unspecified lower leg, initial encounter: Secondary | ICD-10-CM | POA: Insufficient documentation

## 2012-11-27 DIAGNOSIS — Y9389 Activity, other specified: Secondary | ICD-10-CM | POA: Insufficient documentation

## 2012-11-27 DIAGNOSIS — Z79899 Other long term (current) drug therapy: Secondary | ICD-10-CM | POA: Insufficient documentation

## 2012-11-27 DIAGNOSIS — IMO0002 Reserved for concepts with insufficient information to code with codable children: Secondary | ICD-10-CM | POA: Insufficient documentation

## 2012-11-27 NOTE — ED Notes (Signed)
Pt reports having a piece of equiptment run over her great toe at work last night. No swelling or bruising noted. Pt reports pain.

## 2012-11-27 NOTE — ED Provider Notes (Signed)
History  This chart was scribed for non-physician practitioner working with Gilda Crease, * by Bennett Scrape, ED Scribe. This patient was seen in room TR10C/TR10C and the patient's care was started at 17:06.  CSN: [id number]  Arrival date & time 11/27/12  1601   First MD Initiated Contact with Patient 11/27/12 1702      Chief Complaint  Patient presents with  . Toe Injury   Patient is a 27 y.o. female presenting with foot injury. The history is provided by the patient. No language interpreter was used.  Foot Injury Location:  Toe Time since incident:  1 day Injury: yes   Mechanism of injury: crush   Toe location:  L big toe Pain details:    Quality:  Aching   Radiates to:  Does not radiate   Severity:  Moderate   Onset quality:  Sudden   Timing:  Constant   Progression:  Unchanged Chronicity:  New Dislocation: no   Foreign body present:  No foreign bodies Prior injury to area:  Unable to specify Relieved by:  Nothing Worsened by:  Nothing tried   [name] is a 27 y.o. female who presents to the Emergency Department complaining of 1 day of sudden onset, unchanged, moderate left great toe pain as the result of crushing mechanism. Pain is constant, described as soreness, worse with weight bearing, and alleviated by nothing. Pt states that a heavy mail cart rolled over her left foot while at work. No blood loss. Symptoms have not been treated PTA. No weakness or numbness. Pt denies tobacco, alcohol, and illicit drug use.    Past Medical History  Diagnosis Date  . Polycystic ovarian syndrome   . Asthma   . Hypertension   . DUB (dysfunctional uterine bleeding)   . Morbid obesity   . Gonorrhea June 2013  . Genital HSV     Past Surgical History  Procedure Laterality Date  . No past surgeries    . Dilation and curettage of uterus  08/15/2011    Procedure: DILATATION AND CURETTAGE (D&C);  Surgeon: Leanora Ivanoff. Marice Potter, MD;  Location: [facility];  Service:  Gynecology;  Laterality: N/A;    Family History  Problem Relation Age of Onset  . Cancer Mother     ovarian  . Diabetes Mother   . Asthma Mother   . Diabetes Father   . Asthma Father   . Asthma Sister   . Asthma Brother     History  Substance Use Topics  . Smoking status: Never Smoker   . Smokeless tobacco: Never Used  . Alcohol Use: No    OB History   Grav Para Term Preterm Abortions TAB SAB Ect Mult Living   1 0 0 0 1 0 1 0 0 0       Review of Systems  Skin: Positive for wound.  Neurological: Negative for weakness and numbness.  All other systems reviewed and are negative.    Allergies  Latex  Home Medications   Current Outpatient Rx  Name  Route  Sig  Dispense  Refill  . albuterol (PROVENTIL HFA;VENTOLIN HFA) 108 (90 BASE) MCG/ACT inhaler   Inhalation   Inhale 2 puffs into the lungs daily as needed for wheezing or shortness of breath.          . Aspirin-Acetaminophen-Caffeine (GOODY HEADACHE PO)   Oral   Take 1 packet by mouth daily as needed. For pain         . megestrol (MEGACE)  20 MG tablet   Oral   Take 20 mg by mouth daily. End of tapered dose, 7 days remaining         . Prenatal Vit-Fe Fumarate-FA (PRENATAL MULTIVITAMIN) TABS   Oral   Take 1 tablet by mouth daily.         . valACYclovir (VALTREX) 500 MG tablet   Oral   Take 500 mg by mouth daily.           BP 147/93  Pulse 76  Temp(Src) 98.2 F (36.8 C) (Oral)  Resp 14  SpO2 100%  Physical Exam  Constitutional: She is oriented to person, place, and time. She appears well-developed.  HENT:  Head: Normocephalic.  Eyes: Conjunctivae are normal.  Neck: No tracheal deviation present.  Cardiovascular:  No murmur heard. Musculoskeletal: Normal range of motion.  Mild bruising of the left great toe. No hematoma under the nail.  Neurological: She is oriented to person, place, and time.  Skin: Skin is warm.  Psychiatric: She has a normal mood and affect.    ED Course   Procedures DIAGNOSTIC STUDIES: Oxygen Saturation is 100% on room air, normal by my interpretation.    COORDINATION OF CARE: 17:06- Evaluated Pt. Pt is awake, alert, and without distress. 17:11- Patient understand and agree with initial ED impression and plan with expectations set for ED visit. 17:12- Ordered DG Toe Great Left 1 time imaging.    Labs Reviewed - No data to display Dg Toe Great Left  11/27/2012  *RADIOLOGY REPORT*  Clinical Data: Toe injury  LEFT GREAT TOE  Comparison: None.  Findings: No acute fracture and no dislocation.  IMPRESSION: No acute bony pathology.   Original Report Authenticated By: Jolaine Click, M.D.      1. Toe pain     Radiology results reviewed, shared with patient.  MDM    I personally performed the services described in this documentation, which was scribed in my presence. The recorded information has been reviewed and is accurate.        Jimmye Norman, NP 11/28/12 [phone]

## 2012-11-27 NOTE — ED Notes (Signed)
NAD noted at time of d/c home 

## 2012-11-27 NOTE — ED Notes (Signed)
Ortho Tech paged and on way to ED with post op shoe

## 2012-11-27 NOTE — Progress Notes (Signed)
Orthopedic Tech Progress Note Patient Details:  [name] [birth year] [id number]  Ortho Devices Type of Ortho Device: Postop shoe/boot Ortho Device/Splint Location: (L) LE Ortho Device/Splint Interventions: Application   [name] 11/27/2012, 6:26 PM

## 2012-11-28 NOTE — ED Provider Notes (Signed)
Medical screening examination/treatment/procedure(s) were performed by non-physician practitioner and as supervising physician I was immediately available for consultation/collaboration.  Metta Koranda J. Shandel Busic, MD 11/28/12 1217 

## 2012-12-19 ENCOUNTER — Ambulatory Visit (INDEPENDENT_AMBULATORY_CARE_PROVIDER_SITE_OTHER): Payer: Self-pay | Admitting: Obstetrics & Gynecology

## 2012-12-19 ENCOUNTER — Encounter: Payer: Self-pay | Admitting: Obstetrics & Gynecology

## 2012-12-19 VITALS — BP 130/80 | Temp 99.2°F | Ht 68.0 in | Wt 346.0 lb

## 2012-12-19 DIAGNOSIS — D649 Anemia, unspecified: Secondary | ICD-10-CM

## 2012-12-19 LAB — TSH: TSH: 1.948 u[IU]/mL (ref 0.350–4.500)

## 2012-12-19 NOTE — Progress Notes (Signed)
  Subjective:    Patient ID: [name], female    DOB: 07-13-1986, 27 y.o.   MRN: [id number]  HPI  27 yo M AA G0P0A1(sp AB) who has anemia and DUB/menorrhagia. She wants to have another d&c to help this problem. She has been on Megace 20 mg daily and still have bleeding.   Review of Systems GC 2013    Objective:   Physical Exam        Assessment & Plan:  DUB/menorrhagia/morbid obesity I will check a TSH and cervical cultures. We have discussed weight loss. She agrees to only drink water (generally drinks lots of fruit punch and OJ) for the next month. She will come back for a weight check in 4 weeks.

## 2012-12-19 NOTE — Progress Notes (Deleted)
  Subjective:    Patient ID: [name], female    DOB: [birth year], 27 y.o.   MRN: [id number]  HPI    Review of Systems     Objective:   Physical Exam        Assessment & Plan:

## 2012-12-19 NOTE — Progress Notes (Signed)
States has been bleeding for 3 months everyday, is light bleeding,  and last 3 weeks passing clots with lots of cramping

## 2012-12-26 ENCOUNTER — Emergency Department (HOSPITAL_COMMUNITY)
Admission: EM | Admit: 2012-12-26 | Discharge: 2012-12-26 | Disposition: A | Discharge status: Home / Self Care | Payer: Self-pay | Attending: Emergency Medicine | Admitting: Emergency Medicine

## 2012-12-26 ENCOUNTER — Encounter (HOSPITAL_COMMUNITY): Payer: Self-pay | Admitting: *Deleted

## 2012-12-26 DIAGNOSIS — R51 Headache: Secondary | ICD-10-CM | POA: Insufficient documentation

## 2012-12-26 DIAGNOSIS — H5789 Other specified disorders of eye and adnexa: Secondary | ICD-10-CM | POA: Insufficient documentation

## 2012-12-26 DIAGNOSIS — Z862 Personal history of diseases of the blood and blood-forming organs and certain disorders involving the immune mechanism: Secondary | ICD-10-CM | POA: Insufficient documentation

## 2012-12-26 DIAGNOSIS — Z8742 Personal history of other diseases of the female genital tract: Secondary | ICD-10-CM | POA: Insufficient documentation

## 2012-12-26 DIAGNOSIS — H109 Unspecified conjunctivitis: Secondary | ICD-10-CM | POA: Insufficient documentation

## 2012-12-26 DIAGNOSIS — J45909 Unspecified asthma, uncomplicated: Secondary | ICD-10-CM | POA: Insufficient documentation

## 2012-12-26 DIAGNOSIS — Z8639 Personal history of other endocrine, nutritional and metabolic disease: Secondary | ICD-10-CM | POA: Insufficient documentation

## 2012-12-26 DIAGNOSIS — Z79899 Other long term (current) drug therapy: Secondary | ICD-10-CM | POA: Insufficient documentation

## 2012-12-26 DIAGNOSIS — Z8619 Personal history of other infectious and parasitic diseases: Secondary | ICD-10-CM | POA: Insufficient documentation

## 2012-12-26 DIAGNOSIS — H539 Unspecified visual disturbance: Secondary | ICD-10-CM | POA: Insufficient documentation

## 2012-12-26 DIAGNOSIS — H53149 Visual discomfort, unspecified: Secondary | ICD-10-CM | POA: Insufficient documentation

## 2012-12-26 DIAGNOSIS — I10 Essential (primary) hypertension: Secondary | ICD-10-CM | POA: Insufficient documentation

## 2012-12-26 MED ORDER — PROPARACAINE HCL 0.5 % OP SOLN
1.0000 [drp] | Freq: Once | OPHTHALMIC | Status: AC
  Administered 2012-12-26: 1 [drp] via OPHTHALMIC
  Filled 2012-12-26: qty 15

## 2012-12-26 MED ORDER — POLYMYXIN B-TRIMETHOPRIM 10000-0.1 UNIT/ML-% OP SOLN
1.0000 [drp] | OPHTHALMIC | Status: DC
Start: 2012-12-27 — End: 2012-12-27
  Administered 2012-12-26: 1 [drp] via OPHTHALMIC
  Filled 2012-12-26 (×2): qty 10

## 2012-12-26 MED ORDER — IBUPROFEN 400 MG PO TABS
800.0000 mg | ORAL_TABLET | Freq: Once | ORAL | Status: AC
  Administered 2012-12-26: 800 mg via ORAL
  Filled 2012-12-26: qty 2

## 2012-12-26 MED ORDER — FLUORESCEIN SODIUM 1 MG OP STRP
1.0000 | ORAL_STRIP | Freq: Once | OPHTHALMIC | Status: AC
  Administered 2012-12-26: 1 via OPHTHALMIC
  Filled 2012-12-26: qty 1

## 2012-12-26 MED ORDER — IBUPROFEN 800 MG PO TABS: 800.0000 mg | ORAL_TABLET | Freq: Three times a day (TID) | ORAL | Status: DC | PRN

## 2012-12-26 NOTE — ED Notes (Signed)
Pt states she is irritated and wants to go home. MD notified

## 2012-12-26 NOTE — ED Provider Notes (Signed)
History    This chart was scribed for non-physician practitioner working with Raeford Razor, MD by ED Scribe, Burman Nieves. This patient was seen in room TR04C/TR04C and the patient's care was started at 8:47 PM.   CSN: [id number]  Arrival date & time 12/26/12  1818   First MD Initiated Contact with Patient 12/26/12 2047      Chief Complaint  Patient presents with  . Eye Pain    (Consider location/radiation/quality/duration/timing/severity/associated sxs/prior treatment) The history is provided by the patient. No language interpreter was used.   [name] is a 27 y.o. female who presents to the Emergency Department complaining of moderate constant left eye pain onset yesterday. Pt reports that yesterday nothing was wrong the appearance of her eye she just noticed some pain. Today when she woke up she was experiencing pain with watery discharge preventing her from opening her eye.  Pt states that it "feels like someone is scratching her eye". Pain is exacerbated when she opens her eye and denies any double vision. There also is some associated pain in her upper cheek and a headache. Pt denies fever, chills, cough, nausea, vomiting, diarrhea, SOB, weakness, and any other associated symptoms.Pt denies any chance of pregnancy and is not currently breast feeding.   Past Medical History  Diagnosis Date  . Polycystic ovarian syndrome   . Asthma   . Hypertension   . DUB (dysfunctional uterine bleeding)   . Morbid obesity   . Gonorrhea June 2013  . Genital HSV     Past Surgical History  Procedure Laterality Date  . No past surgeries    . Dilation and curettage of uterus  08/15/2011    Procedure: DILATATION AND CURETTAGE (D&C);  Surgeon: Leanora Ivanoff. Marice Potter, MD;  Location: [facility];  Service: Gynecology;  Laterality: N/A;    Family History  Problem Relation Age of Onset  . Cancer Mother     ovarian  . Diabetes Mother   . Asthma Mother   . Diabetes Father   . Asthma Father   . Asthma  Sister   . Asthma Brother     History  Substance Use Topics  . Smoking status: Never Smoker   . Smokeless tobacco: Never Used  . Alcohol Use: No    OB History   Grav Para Term Preterm Abortions TAB SAB Ect Mult Living   1 0 0 0 1 0 1 0 0 0       Review of Systems  Eyes: Positive for photophobia, pain, discharge, redness, itching and visual disturbance.  Neurological: Positive for headaches.  All other systems reviewed and are negative.    Allergies  Latex  Home Medications   Current Outpatient Rx  Name  Route  Sig  Dispense  Refill  . albuterol (PROVENTIL HFA;VENTOLIN HFA) 108 (90 BASE) MCG/ACT inhaler   Inhalation   Inhale 2 puffs into the lungs daily as needed for wheezing or shortness of breath.          . valACYclovir (VALTREX) 500 MG tablet   Oral   Take 500 mg by mouth daily.           BP 137/75  Pulse 64  Temp(Src) 98.4 F (36.9 C) (Oral)  Resp 18  SpO2 99%  LMP 09/26/2012  Physical Exam  Nursing note and vitals reviewed. Constitutional: She is oriented to person, place, and time. She appears well-developed and well-nourished. No distress.  HENT:  Head: Normocephalic and atraumatic.  Eyes: EOM are normal. Pupils  are equal, round, and reactive to light. Left eye exhibits discharge and exudate. Right conjunctiva is not injected. Right conjunctiva has no hemorrhage. Left conjunctiva is injected. Left conjunctiva has no hemorrhage.  Slit lamp exam:      The left eye shows no corneal abrasion, no corneal flare, no corneal ulcer, no foreign body, no hyphema, no hypopyon and no fluorescein uptake.  Crust noted at the border of the left eye lid. No foreign object noted. No cornea abrasion. No Cromosene uptake. No chemosis.   Neck: Neck supple. No tracheal deviation present.  Cardiovascular: Normal rate.   Pulmonary/Chest: Effort normal. No respiratory distress.  Musculoskeletal: Normal range of motion.  Neurological: She is alert and oriented to person,  place, and time.  Skin: Skin is warm and dry.  Psychiatric: She has a normal mood and affect. Her behavior is normal.    ED Course  Procedures (including critical care time) DIAGNOSTIC STUDIES: Oxygen Saturation is 99% on room air, normal by my interpretation.    COORDINATION OF CARE: 10:16 PM Discussed ED treatment with pt and pt agrees. Doubt gonoccocal conjunctivitis.    Labs Reviewed - No data to display No results found.   1. Conjunctivitis of left eye       MDM  I personally performed the services described in this documentation, which was scribed in my presence. The recorded information has been reviewed and is accurate.          Fayrene Helper, PA-C 12/26/12 2250

## 2012-12-26 NOTE — ED Notes (Signed)
Pt to ED c/o pain to L eye since yesterday.  Pain increases when pt tries to open eye.  Pt feels like "something is scratching her eye" when she moves her eyeball R and L.

## 2012-12-27 NOTE — ED Provider Notes (Signed)
Medical screening examination/treatment/procedure(s) were performed by non-physician practitioner and as supervising physician I was immediately available for consultation/collaboration.  Raeford Razor, MD 12/27/12 [phone]

## 2013-01-15 ENCOUNTER — Encounter (HOSPITAL_COMMUNITY): Payer: Self-pay | Admitting: Pharmacist

## 2013-01-16 ENCOUNTER — Ambulatory Visit: Payer: Self-pay | Admitting: Obstetrics & Gynecology

## 2013-01-17 ENCOUNTER — Ambulatory Visit (HOSPITAL_COMMUNITY): Payer: Self-pay | Admitting: Anesthesiology

## 2013-01-17 ENCOUNTER — Ambulatory Visit (HOSPITAL_COMMUNITY)
Admission: RE | Admit: 2013-01-17 | Discharge: 2013-01-17 | Disposition: A | Discharge status: Home / Self Care | Payer: Self-pay | Source: Ambulatory Visit | Attending: Obstetrics & Gynecology | Admitting: Obstetrics & Gynecology

## 2013-01-17 ENCOUNTER — Encounter (HOSPITAL_COMMUNITY): Payer: Self-pay | Admitting: *Deleted

## 2013-01-17 ENCOUNTER — Encounter (HOSPITAL_COMMUNITY): Payer: Self-pay | Admitting: Anesthesiology

## 2013-01-17 ENCOUNTER — Encounter (HOSPITAL_COMMUNITY)
Admission: RE | Disposition: A | Discharge status: Home / Self Care | Payer: Self-pay | Source: Ambulatory Visit | Attending: Obstetrics & Gynecology

## 2013-01-17 DIAGNOSIS — N938 Other specified abnormal uterine and vaginal bleeding: Secondary | ICD-10-CM

## 2013-01-17 DIAGNOSIS — N949 Unspecified condition associated with female genital organs and menstrual cycle: Secondary | ICD-10-CM

## 2013-01-17 HISTORY — PX: DILATION AND CURETTAGE OF UTERUS: SHX78

## 2013-01-17 LAB — CBC
HCT: 35.6 % — ABNORMAL LOW (ref 36.0–46.0)
Hemoglobin: 10.9 g/dL — ABNORMAL LOW (ref 12.0–15.0)
RBC: 4.95 MIL/uL (ref 3.87–5.11)
WBC: 5.1 10*3/uL (ref 4.0–10.5)

## 2013-01-17 LAB — PREGNANCY, URINE: Preg Test, Ur: NEGATIVE

## 2013-01-17 SURGERY — DILATION AND CURETTAGE
Anesthesia: General | Site: Uterus | Wound class: Clean Contaminated

## 2013-01-17 MED ORDER — DEXAMETHASONE SODIUM PHOSPHATE 10 MG/ML IJ SOLN
INTRAMUSCULAR | Status: AC
  Filled 2013-01-17: qty 1

## 2013-01-17 MED ORDER — KETOROLAC TROMETHAMINE 30 MG/ML IJ SOLN
INTRAMUSCULAR | Status: DC | PRN
  Administered 2013-01-17: 15 mg via INTRAVENOUS

## 2013-01-17 MED ORDER — LACTATED RINGERS IV SOLN
INTRAVENOUS | Status: DC
  Administered 2013-01-17 (×2): via INTRAVENOUS

## 2013-01-17 MED ORDER — FENTANYL CITRATE 0.05 MG/ML IJ SOLN
INTRAMUSCULAR | Status: AC
  Filled 2013-01-17: qty 2

## 2013-01-17 MED ORDER — BUPIVACAINE HCL 0.5 % IJ SOLN
INTRAMUSCULAR | Status: DC | PRN
  Administered 2013-01-17: 25 mL

## 2013-01-17 MED ORDER — LIDOCAINE HCL (CARDIAC) 20 MG/ML IV SOLN
INTRAVENOUS | Status: DC | PRN
  Administered 2013-01-17: 50 mg via INTRAVENOUS

## 2013-01-17 MED ORDER — PROPOFOL 10 MG/ML IV EMUL
INTRAVENOUS | Status: DC | PRN
  Administered 2013-01-17: 200 mg via INTRAVENOUS

## 2013-01-17 MED ORDER — FENTANYL CITRATE 0.05 MG/ML IJ SOLN
INTRAMUSCULAR | Status: DC | PRN
  Administered 2013-01-17: 100 ug via INTRAVENOUS

## 2013-01-17 MED ORDER — OXYCODONE-ACETAMINOPHEN 5-325 MG PO TABS: 1.0000 | ORAL_TABLET | ORAL | Status: DC | PRN

## 2013-01-17 MED ORDER — DEXTROSE 5 % IV SOLN
3.0000 g | INTRAVENOUS | Status: AC
  Administered 2013-01-17: 3 g via INTRAVENOUS
  Filled 2013-01-17: qty 3000

## 2013-01-17 MED ORDER — DEXAMETHASONE SODIUM PHOSPHATE 10 MG/ML IJ SOLN
INTRAMUSCULAR | Status: DC | PRN
  Administered 2013-01-17: 10 mg via INTRAVENOUS

## 2013-01-17 MED ORDER — MIDAZOLAM HCL 2 MG/2ML IJ SOLN
INTRAMUSCULAR | Status: AC
  Filled 2013-01-17: qty 2

## 2013-01-17 MED ORDER — MIDAZOLAM HCL 5 MG/5ML IJ SOLN
INTRAMUSCULAR | Status: DC | PRN
  Administered 2013-01-17: 2 mg via INTRAVENOUS

## 2013-01-17 MED ORDER — ONDANSETRON HCL 4 MG/2ML IJ SOLN
INTRAMUSCULAR | Status: AC
  Filled 2013-01-17: qty 2

## 2013-01-17 MED ORDER — BUPIVACAINE HCL (PF) 0.5 % IJ SOLN
INTRAMUSCULAR | Status: AC
  Filled 2013-01-17: qty 30

## 2013-01-17 MED ORDER — ONDANSETRON HCL 4 MG/2ML IJ SOLN
INTRAMUSCULAR | Status: DC | PRN
  Administered 2013-01-17: 4 mg via INTRAVENOUS

## 2013-01-17 MED ORDER — LIDOCAINE HCL (CARDIAC) 20 MG/ML IV SOLN
INTRAVENOUS | Status: AC
  Filled 2013-01-17: qty 5

## 2013-01-17 MED ORDER — 0.9 % SODIUM CHLORIDE (POUR BTL) OPTIME
TOPICAL | Status: DC | PRN
  Administered 2013-01-17: 1000 mL

## 2013-01-17 MED ORDER — KETOROLAC TROMETHAMINE 30 MG/ML IJ SOLN
INTRAMUSCULAR | Status: AC
  Filled 2013-01-17: qty 1

## 2013-01-17 MED ORDER — PROPOFOL 10 MG/ML IV EMUL
INTRAVENOUS | Status: AC
  Filled 2013-01-17: qty 20

## 2013-01-17 SURGICAL SUPPLY — 13 items
CATH ROBINSON RED A/P 16FR (CATHETERS) ×2 IMPLANT
CLOTH BEACON ORANGE TIMEOUT ST (SAFETY) ×2 IMPLANT
CONTAINER PREFILL 10% NBF 60ML (FORM) IMPLANT
DILATOR CANAL MILEX (MISCELLANEOUS) IMPLANT
DRESSING TELFA 8X3 (GAUZE/BANDAGES/DRESSINGS) ×2 IMPLANT
GLOVE BIO SURGEON STRL SZ 6.5 (GLOVE) ×2 IMPLANT
GOWN STRL REIN XL XLG (GOWN DISPOSABLE) ×4 IMPLANT
NEEDLE SPNL 18GX3.5 QUINCKE PK (NEEDLE) ×2 IMPLANT
PACK VAGINAL MINOR WOMEN LF (CUSTOM PROCEDURE TRAY) ×2 IMPLANT
PAD OB MATERNITY 4.3X12.25 (PERSONAL CARE ITEMS) ×2 IMPLANT
PAD PREP 24X48 CUFFED NSTRL (MISCELLANEOUS) ×2 IMPLANT
SYR CONTROL 10ML LL (SYRINGE) ×2 IMPLANT
TOWEL OR 17X24 6PK STRL BLUE (TOWEL DISPOSABLE) ×4 IMPLANT

## 2013-01-17 NOTE — H&P (Signed)
[name] is an 27 y.o. female M AA G0 here for second d&c for DUB. She is anemic.  Pertinent Gynecological History: Menses: flow is excessive with use of 4 pads or tampons on heaviest days Bleeding: dysfunctional uterine bleeding "all month long" Contraception: none DES exposure: denies Blood transfusions: none Sexually transmitted diseases: no past history Previous GYN Procedures: DNC  Last mammogram: n/a Date: Last pap: normal Date: 2013 OB History: G0, P0   Menstrual History: Menarche age: 17 No LMP recorded.    Past Medical History  Diagnosis Date  . Polycystic ovarian syndrome   . Asthma   . DUB (dysfunctional uterine bleeding)   . Morbid obesity   . Gonorrhea June 2013  . Genital HSV   . Hypertension     occasional htn but no meds required    Past Surgical History  Procedure Laterality Date  . Dilation and curettage of uterus  08/15/2011    Procedure: DILATATION AND CURETTAGE (D&C);  Surgeon: Leanora Ivanoff. Marice Potter, MD;  Location: [facility];  Service: Gynecology;  Laterality: N/A;    Family History  Problem Relation Age of Onset  . Cancer Mother     ovarian  . Diabetes Mother   . Asthma Mother   . Diabetes Father   . Asthma Father   . Asthma Sister   . Asthma Brother     Social History:  reports that she has never smoked. She has never used smokeless tobacco. She reports that she does not drink alcohol or use illicit drugs.  Allergies:  Allergies  Allergen Reactions  . Latex Hives    Prescriptions prior to admission  Medication Sig Dispense Refill  . ibuprofen (ADVIL,MOTRIN) 800 MG tablet Take 1 tablet (800 mg total) by mouth every 8 (eight) hours as needed for pain.  30 tablet  0  . valACYclovir (VALTREX) 500 MG tablet Take 500 mg by mouth daily.      Marland Kitchen albuterol (PROVENTIL HFA;VENTOLIN HFA) 108 (90 BASE) MCG/ACT inhaler Inhale 2 puffs into the lungs daily as needed for wheezing or shortness of breath.         Review of Systems  Musculoskeletal:  Negative for falls.    Blood pressure 138/71, pulse 85, temperature 98.2 F (36.8 C), temperature source Oral, resp. rate 18, height 5\' 8"  (1.727 m), weight 156.945 kg (346 lb), SpO2 100.00%, not currently breastfeeding. Physical Exam Heart- rrr Lungs- CTAB Abd- benign Results for orders placed during the hospital encounter of 01/17/13 (from the past 24 hour(s))  PREGNANCY, URINE     Status: None   Collection Time    01/17/13  2:45 PM      Result Value Range   Preg Test, Ur NEGATIVE  NEGATIVE  CBC     Status: Abnormal   Collection Time    01/17/13  2:45 PM      Result Value Range   WBC 5.1  4.0 - 10.5 K/uL   RBC 4.95  3.87 - 5.11 MIL/uL   Hemoglobin 10.9 (*) 12.0 - 15.0 g/dL   HCT 16.1 (*) 09.6 - 04.5 %   MCV 71.9 (*) 78.0 - 100.0 fL   MCH 22.0 (*) 26.0 - 34.0 pg   MCHC 30.6  30.0 - 36.0 g/dL   RDW 40.9 (*) 81.1 - 91.4 %   Platelets 317  150 - 400 K/uL    No results found.  Assessment/Plan: DUB- for dilation and curettage  Marsia Cino C. 01/17/2013, 4:06 PM

## 2013-01-17 NOTE — Anesthesia Postprocedure Evaluation (Signed)
Anesthesia Post Note  Patient: [name]  Procedure(s) Performed: Procedure(s) (LRB): DILATATION AND CURETTAGE (N/A)  Anesthesia type: General  Patient location: PACU  Post pain: Pain level controlled  Post assessment: Post-op Vital signs reviewed  Last Vitals:  Filed Vitals:   01/17/13 1715  BP: 127/68  Pulse: 63  Temp:   Resp: 20    Post vital signs: Reviewed  Level of consciousness: sedated  Complications: No apparent anesthesia complications

## 2013-01-17 NOTE — Op Note (Signed)
01/17/2013  4:35 PM  PATIENT:  [name]  27 y.o. female  PRE-OPERATIVE DIAGNOSIS:  DUB, anemia, morbid obesity  POST-OPERATIVE DIAGNOSIS: same  PROCEDURE:  Procedure(s): DILATATION AND CURETTAGE (N/A)  SURGEON:  Surgeon(s) and Role:    * Allie Bossier, MD - Primary  PHYSICIAN ASSISTANT:   ASSISTANTS: none   ANESTHESIA:   general  EBL:     BLOOD ADMINISTERED:none  DRAINS: none   LOCAL MEDICATIONS USED:  MARCAINE     SPECIMEN:  Source of Specimen:  uterine curettings  DISPOSITION OF SPECIMEN:  PATHOLOGY  COUNTS:  YES  TOURNIQUET:  * No tourniquets in log *  DICTATION: .Dragon Dictation  PLAN OF CARE: Discharge to home after PACU  PATIENT DISPOSITION:  PACU - hemodynamically stable.   Delay start of Pharmacological VTE agent (>24hrs) due to surgical blood loss or risk of bleeding: not applicable           The risks, benefits, and alternatives of surgery were explained, understood, and accepted. All questions were answered. Consents were signed. I explained to her that this procedure is done for diagnostic purposes as well as hopefully causing the cessation of her dysfunctional uterine bleeding. In the operating room general anesthesia was applied without complication. She was placed in the dorsal lithotomy position and her vagina was prepped and draped in the usual sterile fashion. A bimanual exam revealed a normal size and shape, anteverted mobile uterus. Her adnexa were nonenlarged. A speculum was placed and a single-tooth tenaculum was used to grasp the anterior lip of her nulliparous cervix. A total of 25 mL of 0.5% Marcaine was used to perform a paracervical block. Her uterus sounded to 8 cm. Her cervix was carefully and slowly and easily dilated to accommodate a small curet. A curettage was done in all quadrants and the fundus of the uterus. A small to moderate amount of tissue was obtained. There was no bleeding noted at the end of the case. She was  taken to the recovery room after being extubated. She tolerated the procedure well.

## 2013-01-17 NOTE — Anesthesia Preprocedure Evaluation (Signed)
Anesthesia Evaluation  Patient identified by MRN, date of birth, ID band Patient awake    Reviewed: Allergy & Precautions, H&P , NPO status , Patient's Chart, lab work & pertinent test results  Airway Mallampati: III TM Distance: >3 FB Neck ROM: full    Dental no notable dental hx.    Pulmonary neg pulmonary ROS,    Pulmonary exam normal       Cardiovascular     Neuro/Psych negative neurological ROS  negative psych ROS   GI/Hepatic negative GI ROS, Neg liver ROS,   Endo/Other  Morbid obesity  Renal/GU negative Renal ROS  negative genitourinary   Musculoskeletal   Abdominal (+) + obese,   Peds negative pediatric ROS (+)  Hematology negative hematology ROS (+)   Anesthesia Other Findings   Reproductive/Obstetrics negative OB ROS                           Anesthesia Physical Anesthesia Plan  ASA: III  Anesthesia Plan: General   Post-op Pain Management:    Induction: Intravenous  Airway Management Planned: LMA  Additional Equipment:   Intra-op Plan:   Post-operative Plan:   Informed Consent: I have reviewed the patients History and Physical, chart, labs and discussed the procedure including the risks, benefits and alternatives for the proposed anesthesia with the patient or authorized representative who has indicated his/her understanding and acceptance.     Plan Discussed with: CRNA and Surgeon  Anesthesia Plan Comments:         Anesthesia Quick Evaluation

## 2013-01-17 NOTE — Transfer of Care (Signed)
Immediate Anesthesia Transfer of Care Note  Patient: [name]  Procedure(s) Performed: Procedure(s): DILATATION AND CURETTAGE (N/A)  Patient Location: PACU  Anesthesia Type:General  Level of Consciousness: awake, alert , oriented and patient cooperative  Airway & Oxygen Therapy: Patient Spontanous Breathing and Patient connected to nasal cannula oxygen  Post-op Assessment: Report given to PACU RN, Post -op Vital signs reviewed and stable and Patient moving all extremities X 4  Post vital signs: Reviewed and stable  Complications: No apparent anesthesia complications

## 2013-01-18 ENCOUNTER — Encounter (HOSPITAL_COMMUNITY): Payer: Self-pay | Admitting: Obstetrics & Gynecology

## 2013-01-28 ENCOUNTER — Encounter: Payer: Self-pay | Admitting: Obstetrics & Gynecology

## 2013-01-31 ENCOUNTER — Encounter: Payer: Self-pay | Admitting: Obstetrics & Gynecology

## 2013-01-31 ENCOUNTER — Telehealth: Payer: Self-pay

## 2013-01-31 NOTE — Telephone Encounter (Signed)
Called patient, no answer and heard message this voicemail box has not been set up yet- unable to leave message.

## 2013-01-31 NOTE — Telephone Encounter (Signed)
Patient called to get a note for work about time off after her D&C on 01/17/13.

## 2013-02-04 NOTE — Telephone Encounter (Signed)
Called [name] and she states she got everything she needed because she came up to clinic  One day last week.

## 2013-03-08 ENCOUNTER — Encounter (HOSPITAL_COMMUNITY): Payer: Self-pay | Admitting: *Deleted

## 2013-03-08 ENCOUNTER — Emergency Department (HOSPITAL_COMMUNITY)
Admission: EM | Admit: 2013-03-08 | Discharge: 2013-03-08 | Disposition: A | Discharge status: Home / Self Care | Payer: Self-pay | Attending: Emergency Medicine | Admitting: Emergency Medicine

## 2013-03-08 DIAGNOSIS — M533 Sacrococcygeal disorders, not elsewhere classified: Secondary | ICD-10-CM

## 2013-03-08 DIAGNOSIS — J45909 Unspecified asthma, uncomplicated: Secondary | ICD-10-CM | POA: Insufficient documentation

## 2013-03-08 DIAGNOSIS — Z8742 Personal history of other diseases of the female genital tract: Secondary | ICD-10-CM | POA: Insufficient documentation

## 2013-03-08 DIAGNOSIS — Z862 Personal history of diseases of the blood and blood-forming organs and certain disorders involving the immune mechanism: Secondary | ICD-10-CM | POA: Insufficient documentation

## 2013-03-08 DIAGNOSIS — Z91040 Latex allergy status: Secondary | ICD-10-CM | POA: Insufficient documentation

## 2013-03-08 DIAGNOSIS — IMO0002 Reserved for concepts with insufficient information to code with codable children: Secondary | ICD-10-CM | POA: Insufficient documentation

## 2013-03-08 DIAGNOSIS — Z8639 Personal history of other endocrine, nutritional and metabolic disease: Secondary | ICD-10-CM | POA: Insufficient documentation

## 2013-03-08 DIAGNOSIS — Z79899 Other long term (current) drug therapy: Secondary | ICD-10-CM | POA: Insufficient documentation

## 2013-03-08 DIAGNOSIS — Y92009 Unspecified place in unspecified non-institutional (private) residence as the place of occurrence of the external cause: Secondary | ICD-10-CM | POA: Insufficient documentation

## 2013-03-08 DIAGNOSIS — Z8619 Personal history of other infectious and parasitic diseases: Secondary | ICD-10-CM | POA: Insufficient documentation

## 2013-03-08 DIAGNOSIS — W108XXA Fall (on) (from) other stairs and steps, initial encounter: Secondary | ICD-10-CM | POA: Insufficient documentation

## 2013-03-08 DIAGNOSIS — I10 Essential (primary) hypertension: Secondary | ICD-10-CM | POA: Insufficient documentation

## 2013-03-08 DIAGNOSIS — Y9389 Activity, other specified: Secondary | ICD-10-CM | POA: Insufficient documentation

## 2013-03-08 MED ORDER — DOCUSATE SODIUM 100 MG PO CAPS: 100.0000 mg | ORAL_CAPSULE | Freq: Two times a day (BID) | ORAL | Status: DC

## 2013-03-08 MED ORDER — MELOXICAM 15 MG PO TABS: ORAL_TABLET | ORAL | Status: DC

## 2013-03-08 MED ORDER — HYDROCODONE-ACETAMINOPHEN 5-325 MG PO TABS
1.0000 | ORAL_TABLET | Freq: Four times a day (QID) | ORAL | Status: DC | PRN
Start: 2013-03-08 — End: 2013-09-16

## 2013-03-08 NOTE — ED Provider Notes (Signed)
History     CSN: [id number]  Arrival date & time 03/08/13  1232   First MD Initiated Contact with Patient 03/08/13 1425      Chief Complaint  Patient presents with  . Back Pain  . Fall    (Consider location/radiation/quality/duration/timing/severity/associated sxs/prior treatment) HPI Comments: Patient fell down a flight of stairs about 2 weeks ago on her tailbone. Denies hitting head of loosing consciousness. She c/o severe pain in tailbone that has remained unchanged since fall. Patient is able to ambulate and has been able to complete ADLS and job duties with significant discomfort and pain  Patient is a 27 y.o. female presenting with fall and back pain. The history is provided by the patient. No language interpreter was used.  Fall Pertinent negatives include no abdominal pain, chest pain, fever, headaches, numbness, rash or weakness.  Back Pain Pain location: sacral. Quality:  Aching Radiates to:  Does not radiate Pain severity:  Moderate Worse during: worse when sitting, worse with bowel movements. Duration:  2 weeks Timing:  Constant Progression:  Unchanged Context: falling   Context: not emotional stress, not jumping from heights, not lifting heavy objects, not MCA, not MVA, not occupational injury, not pedestrian accident, not physical stress, not recent illness, not recent injury and not twisting   Relieved by: not sitting on her left buttocks. Exacerbated by: defecating, sitting on tailbone esp left butticks. Associated symptoms: no abdominal pain, no abdominal swelling, no bladder incontinence, no bowel incontinence, no chest pain, no dysuria, no fever, no headaches, no leg pain, no numbness, no paresthesias, no pelvic pain, no perianal numbness, no tingling, no weakness and no weight loss   Risk factors: lack of exercise and obesity   Risk factors: no hx of cancer, no hx of osteoporosis, no menopause, not pregnant, no recent surgery, no steroid use and no vascular  disease     Past Medical History  Diagnosis Date  . Polycystic ovarian syndrome   . Asthma   . DUB (dysfunctional uterine bleeding)   . Morbid obesity   . Gonorrhea June 2013  . Genital HSV   . Hypertension     occasional htn but no meds required    Past Surgical History  Procedure Laterality Date  . Dilation and curettage of uterus  08/15/2011    Procedure: DILATATION AND CURETTAGE (D&C);  Surgeon: Leanora Ivanoff. Marice Potter, MD;  Location: [facility];  Service: Gynecology;  Laterality: N/A;  . Dilation and curettage of uterus N/A 01/17/2013    Procedure: DILATATION AND CURETTAGE;  Surgeon: Allie Bossier, MD;  Location: [facility];  Service: Gynecology;  Laterality: N/A;    Family History  Problem Relation Age of Onset  . Cancer Mother     ovarian  . Diabetes Mother   . Asthma Mother   . Diabetes Father   . Asthma Father   . Asthma Sister   . Asthma Brother     History  Substance Use Topics  . Smoking status: Never Smoker   . Smokeless tobacco: Never Used  . Alcohol Use: No    OB History   Grav Para Term Preterm Abortions TAB SAB Ect Mult Living   1 0 0 0 1 0 1 0 0 0       Review of Systems  Constitutional: Negative for fever and weight loss.  Cardiovascular: Negative for chest pain.  Gastrointestinal: Negative for abdominal pain and bowel incontinence.  Genitourinary: Negative for bladder incontinence, dysuria and pelvic pain.  Musculoskeletal: Positive for  back pain and gait problem.  Skin: Negative for rash and wound.  Neurological: Negative for tingling, weakness, numbness, headaches and paresthesias.    Allergies  Latex  Home Medications   Current Outpatient Rx  Name  Route  Sig  Dispense  Refill  . albuterol (PROVENTIL HFA;VENTOLIN HFA) 108 (90 BASE) MCG/ACT inhaler   Inhalation   Inhale 2 puffs into the lungs daily as needed for wheezing or shortness of breath.          . valACYclovir (VALTREX) 500 MG tablet   Oral   Take 500 mg by mouth daily.            There were no vitals taken for this visit.  Physical Exam  Nursing note and vitals reviewed. Constitutional: She is oriented to person, place, and time. She appears well-developed and well-nourished. No distress.  Morbidly obese female in NAD  HENT:  Head: Normocephalic and atraumatic.  Eyes: Conjunctivae are normal. No scleral icterus.  Neck: Normal range of motion.  Cardiovascular: Normal rate, regular rhythm and normal heart sounds.  Exam reveals no gallop and no friction rub.   No murmur heard. Pulmonary/Chest: Effort normal and breath sounds normal. No respiratory distress.  Abdominal: Soft. Bowel sounds are normal. She exhibits no distension and no mass. There is no tenderness. There is no guarding.  Musculoskeletal: Normal range of motion.  Very tender to palpation over the coccyx and distal sacrum. NV intact in LEs  Neurological: She is alert and oriented to person, place, and time.  Skin: Skin is warm and dry. She is not diaphoretic.    ED Course  Procedures (including critical care time)  Labs Reviewed - No data to display No results found.   1. Sacral pain   2. Fall at home, initial encounter       MDM   BP 142/76  Pulse 70  Temp(Src) 98.6 F (37 C) (Oral)  Resp 18  SpO2 100%  Patient with likely coccyx fracture from fall. I spoke with the patient about xrays stating that it would only confirm diagnosis or rule out diagnosis, however it would not change the plan of care. Patient has agreed that she does not want xray. Pai\tient will be discharged with pain meds and stool softener. Resource guide provided. F/u pcp.       Arthor Captain, PA-C 03/09/13 2031

## 2013-03-08 NOTE — ED Notes (Signed)
Pt fell down 15 carpeted steps 1 week ago. C/o pain in coccyx area. States it hurts to sit.

## 2013-03-08 NOTE — ED Notes (Signed)
Reports having a fall on 5/18 down steps and still having pain to tailbone and it radiates up into her back. Ambulatory at triage.

## 2013-03-11 NOTE — ED Provider Notes (Signed)
Medical screening examination/treatment/procedure(s) were performed by non-physician practitioner and as supervising physician I was immediately available for consultation/collaboration.   Joya Gaskins, MD 03/11/13 2028

## 2013-09-16 ENCOUNTER — Emergency Department (HOSPITAL_COMMUNITY)
Admission: EM | Admit: 2013-09-16 | Discharge: 2013-09-16 | Disposition: A | Discharge status: Home / Self Care | Payer: PRIVATE HEALTH INSURANCE | Attending: Emergency Medicine | Admitting: Emergency Medicine

## 2013-09-16 ENCOUNTER — Encounter (HOSPITAL_COMMUNITY): Payer: Self-pay | Admitting: Emergency Medicine

## 2013-09-16 DIAGNOSIS — IMO0001 Reserved for inherently not codable concepts without codable children: Secondary | ICD-10-CM | POA: Insufficient documentation

## 2013-09-16 DIAGNOSIS — W57XXXA Bitten or stung by nonvenomous insect and other nonvenomous arthropods, initial encounter: Secondary | ICD-10-CM

## 2013-09-16 DIAGNOSIS — I10 Essential (primary) hypertension: Secondary | ICD-10-CM | POA: Insufficient documentation

## 2013-09-16 DIAGNOSIS — Z8742 Personal history of other diseases of the female genital tract: Secondary | ICD-10-CM | POA: Insufficient documentation

## 2013-09-16 DIAGNOSIS — Z91040 Latex allergy status: Secondary | ICD-10-CM | POA: Insufficient documentation

## 2013-09-16 DIAGNOSIS — J45909 Unspecified asthma, uncomplicated: Secondary | ICD-10-CM | POA: Insufficient documentation

## 2013-09-16 DIAGNOSIS — Z8619 Personal history of other infectious and parasitic diseases: Secondary | ICD-10-CM | POA: Insufficient documentation

## 2013-09-16 DIAGNOSIS — Z79899 Other long term (current) drug therapy: Secondary | ICD-10-CM | POA: Insufficient documentation

## 2013-09-16 DIAGNOSIS — Y939 Activity, unspecified: Secondary | ICD-10-CM | POA: Insufficient documentation

## 2013-09-16 DIAGNOSIS — Y929 Unspecified place or not applicable: Secondary | ICD-10-CM | POA: Insufficient documentation

## 2013-09-16 MED ORDER — TRIAMCINOLONE ACETONIDE 0.025 % EX OINT: 1.0000 "application " | TOPICAL_OINTMENT | Freq: Two times a day (BID) | CUTANEOUS | Status: DC

## 2013-09-16 NOTE — ED Provider Notes (Signed)
CSN: [id number]     Arrival date & time 09/16/13  0531 History   First MD Initiated Contact with Patient 09/16/13 0636     Chief Complaint  Patient presents with  . Insect Bite   (Consider location/radiation/quality/duration/timing/severity/associated sxs/prior Treatment) HPI Comments: Patient is a 27 year old female who presents with an insect bite that occurred last night. Patient is not sure what she was bit by but she initially had itching at the posterior left arm. She tried cortisone cream on the area which relieved some of the itching. Now the area is tender and painful. The pain is dull and moderate and worse with palpation. No alleviating factors. No other associated symptoms.    Past Medical History  Diagnosis Date  . Polycystic ovarian syndrome   . Asthma   . DUB (dysfunctional uterine bleeding)   . Morbid obesity   . Gonorrhea June 2013  . Genital HSV   . Hypertension     occasional htn but no meds required   Past Surgical History  Procedure Laterality Date  . Dilation and curettage of uterus  08/15/2011    Procedure: DILATATION AND CURETTAGE (D&C);  Surgeon: Leanora Ivanoff. Marice Potter, MD;  Location: [facility];  Service: Gynecology;  Laterality: N/A;  . Dilation and curettage of uterus N/A 01/17/2013    Procedure: DILATATION AND CURETTAGE;  Surgeon: Allie Bossier, MD;  Location: [facility];  Service: Gynecology;  Laterality: N/A;   Family History  Problem Relation Age of Onset  . Cancer Mother     ovarian  . Diabetes Mother   . Asthma Mother   . Diabetes Father   . Asthma Father   . Asthma Sister   . Asthma Brother    History  Substance Use Topics  . Smoking status: Never Smoker   . Smokeless tobacco: Never Used  . Alcohol Use: No   OB History   Grav Para Term Preterm Abortions TAB SAB Ect Mult Living   1 0 0 0 1 0 1 0 0 0      Review of Systems  Constitutional: Negative for fever, chills and fatigue.  HENT: Negative for trouble swallowing.   Eyes: Negative for visual  disturbance.  Respiratory: Negative for shortness of breath.   Cardiovascular: Negative for chest pain and palpitations.  Gastrointestinal: Negative for nausea, vomiting, abdominal pain and diarrhea.  Genitourinary: Negative for dysuria and difficulty urinating.  Musculoskeletal: Negative for arthralgias and neck pain.  Skin: Positive for wound. Negative for color change.  Neurological: Negative for dizziness and weakness.  Psychiatric/Behavioral: Negative for dysphoric mood.    Allergies  Latex  Home Medications   Current Outpatient Rx  Name  Route  Sig  Dispense  Refill  . albuterol (PROVENTIL HFA;VENTOLIN HFA) 108 (90 BASE) MCG/ACT inhaler   Inhalation   Inhale 2 puffs into the lungs daily as needed for wheezing or shortness of breath.           BP 145/75  Pulse 78  Temp(Src) 97.4 F (36.3 C) (Oral)  Resp 18  Ht 5\' 8"  (1.727 m)  Wt 356 lb (161.481 kg)  BMI 54.14 kg/m2  SpO2 100% Physical Exam  Nursing note and vitals reviewed. Constitutional: She is oriented to person, place, and time. She appears well-developed and well-nourished. No distress.  HENT:  Head: Normocephalic and atraumatic.  Eyes: Conjunctivae are normal.  Neck: Normal range of motion.  Cardiovascular: Normal rate and regular rhythm.  Exam reveals no gallop and no friction rub.  No murmur heard. Pulmonary/Chest: Effort normal and breath sounds normal. She has no wheezes. She has no rales. She exhibits no tenderness.  Musculoskeletal: Normal range of motion.  Neurological: She is alert and oriented to person, place, and time. Coordination normal.  Speech is goal-oriented. Moves limbs without ataxia.   Skin: Skin is warm and dry.  2 quarter sized areas of erythema and induration of posterior left arm. The area is tender to palpate. No open wound.   Psychiatric: She has a normal mood and affect. Her behavior is normal.    ED Course  Procedures (including critical care time) Labs Review Labs  Reviewed - No data to display Imaging Review No results found.  EKG Interpretation   None       MDM   1. Insect bite     6:52 AM Patient will have triamcinolone cream. Vitals stable and patient afebrile. Patient instructed to return with worsening or concerning symptoms.     Emilia Beck, PA-C 09/16/13 1353

## 2013-09-16 NOTE — ED Notes (Signed)
Patient states she was bitten by something around 7pm last night.   Patient states it was itching a lot until the last hour and now is burning.

## 2013-09-16 NOTE — ED Provider Notes (Signed)
Medical screening examination/treatment/procedure(s) were performed by non-physician practitioner and as supervising physician I was immediately available for consultation/collaboration.  EKG Interpretation   None         Hanley Seamen, MD 09/16/13 1906

## 2013-10-07 ENCOUNTER — Emergency Department (HOSPITAL_COMMUNITY)
Admission: EM | Admit: 2013-10-07 | Discharge: 2013-10-07 | Disposition: A | Discharge status: Home / Self Care | Payer: PRIVATE HEALTH INSURANCE | Attending: Emergency Medicine | Admitting: Emergency Medicine

## 2013-10-07 ENCOUNTER — Encounter (HOSPITAL_COMMUNITY): Payer: Self-pay | Admitting: Emergency Medicine

## 2013-10-07 DIAGNOSIS — Y990 Civilian activity done for income or pay: Secondary | ICD-10-CM | POA: Insufficient documentation

## 2013-10-07 DIAGNOSIS — Y9289 Other specified places as the place of occurrence of the external cause: Secondary | ICD-10-CM | POA: Insufficient documentation

## 2013-10-07 DIAGNOSIS — S39012A Strain of muscle, fascia and tendon of lower back, initial encounter: Secondary | ICD-10-CM

## 2013-10-07 DIAGNOSIS — Z8619 Personal history of other infectious and parasitic diseases: Secondary | ICD-10-CM | POA: Insufficient documentation

## 2013-10-07 DIAGNOSIS — I10 Essential (primary) hypertension: Secondary | ICD-10-CM | POA: Insufficient documentation

## 2013-10-07 DIAGNOSIS — S335XXA Sprain of ligaments of lumbar spine, initial encounter: Secondary | ICD-10-CM | POA: Insufficient documentation

## 2013-10-07 DIAGNOSIS — X503XXA Overexertion from repetitive movements, initial encounter: Secondary | ICD-10-CM | POA: Insufficient documentation

## 2013-10-07 DIAGNOSIS — Y9389 Activity, other specified: Secondary | ICD-10-CM | POA: Insufficient documentation

## 2013-10-07 DIAGNOSIS — J45909 Unspecified asthma, uncomplicated: Secondary | ICD-10-CM | POA: Insufficient documentation

## 2013-10-07 DIAGNOSIS — Z8742 Personal history of other diseases of the female genital tract: Secondary | ICD-10-CM | POA: Insufficient documentation

## 2013-10-07 DIAGNOSIS — Z79899 Other long term (current) drug therapy: Secondary | ICD-10-CM | POA: Insufficient documentation

## 2013-10-07 DIAGNOSIS — Z91040 Latex allergy status: Secondary | ICD-10-CM | POA: Insufficient documentation

## 2013-10-07 MED ORDER — HYDROCODONE-ACETAMINOPHEN 5-325 MG PO TABS: 2.0000 | ORAL_TABLET | Freq: Four times a day (QID) | ORAL | Status: DC | PRN

## 2013-10-07 NOTE — ED Provider Notes (Signed)
CSN: [id number]     Arrival date & time 10/07/13  1819 History   First MD Initiated Contact with Patient 10/07/13 2151     Chief Complaint  Patient presents with  . Back Pain   (Consider location/radiation/quality/duration/timing/severity/associated sxs/prior Treatment) HPI 3-4 days gradual onset constant moderately severe diffuse lumbar pain non-radiating positional nonexertional without weakness numbness or change in bowel or bladder function no trauma no IV drug abuse no fever no abdominal pain no vaginal bleeding no vaginal discharge no dysuria no treatment prior to arrival patient thinks her pain is related to lifting heavy boxes at work at the [employer] and needs a few days off so she can recover. Past Medical History  Diagnosis Date  . Polycystic ovarian syndrome   . Asthma   . DUB (dysfunctional uterine bleeding)   . Morbid obesity   . Gonorrhea June 2013  . Genital HSV   . Hypertension     occasional htn but no meds required   Past Surgical History  Procedure Laterality Date  . Dilation and curettage of uterus  08/15/2011    Procedure: DILATATION AND CURETTAGE (D&C);  Surgeon: Leanora Ivanoff. Marice Potter, MD;  Location: [facility];  Service: Gynecology;  Laterality: N/A;  . Dilation and curettage of uterus N/A 01/17/2013    Procedure: DILATATION AND CURETTAGE;  Surgeon: Allie Bossier, MD;  Location: [facility];  Service: Gynecology;  Laterality: N/A;   Family History  Problem Relation Age of Onset  . Cancer Mother     ovarian  . Diabetes Mother   . Asthma Mother   . Diabetes Father   . Asthma Father   . Asthma Sister   . Asthma Brother    History  Substance Use Topics  . Smoking status: Never Smoker   . Smokeless tobacco: Never Used  . Alcohol Use: No   OB History   Grav Para Term Preterm Abortions TAB SAB Ect Mult Living   1 0 0 0 1 0 1 0 0 0      Review of Systems 10 Systems reviewed and are negative for acute change except as noted in the HPI. Allergies  Latex  Home  Medications   Current Outpatient Rx  Name  Route  Sig  Dispense  Refill  . albuterol (PROVENTIL HFA;VENTOLIN HFA) 108 (90 BASE) MCG/ACT inhaler   Inhalation   Inhale 2 puffs into the lungs daily as needed for wheezing or shortness of breath.          . Aspirin-Acetaminophen-Caffeine (GOODYS EXTRA STRENGTH) 260-130-16 MG TABS   Oral   Take 1 packet by mouth daily.         Marland Kitchen triamcinolone (KENALOG) 0.025 % ointment   Topical   Apply 1 application topically 2 (two) times daily. Do not apply to face   15 g   1   . HYDROcodone-acetaminophen (NORCO) 5-325 MG per tablet   Oral   Take 2 tablets by mouth every 6 (six) hours as needed for severe pain.   10 tablet   0    BP 138/92  Pulse 76  Temp(Src) 98.4 F (36.9 C) (Oral)  Resp 16  Wt 357 lb 11.2 oz (162.252 kg)  SpO2 100% Physical Exam  Nursing note and vitals reviewed. Constitutional:  Awake, alert, nontoxic appearance with baseline speech.  HENT:  Head: Atraumatic.  Eyes: Pupils are equal, round, and reactive to light. Right eye exhibits no discharge. Left eye exhibits no discharge.  Neck: Neck supple.  Cardiovascular: Normal  rate and regular rhythm.   No murmur heard. Pulmonary/Chest: Effort normal and breath sounds normal. No respiratory distress. She has no wheezes. She has no rales. She exhibits no tenderness.  Abdominal: Soft. Bowel sounds are normal. She exhibits no mass. There is no tenderness. There is no rebound.  Musculoskeletal: She exhibits tenderness.       Thoracic back: She exhibits no tenderness.       Lumbar back: She exhibits no tenderness.  Bilateral lower extremities non tender without new rashes or color change, baseline ROM with intact DP pulses, CR<2 secs all digits bilaterally, sensation baseline light touch bilaterally for pt, DTR's symmetric and intact bilaterally KJ / AJ, motor symmetric bilateral 5 / 5 hip flexion, quadriceps, hamstrings, EHL, foot dorsiflexion, foot plantarflexion, gait  somewhat antalgic but without apparent new ataxia. Mild diffuse lumbar tenderness.  Neurological:  Mental status baseline for patient.  Upper extremity motor strength and sensation intact and symmetric bilaterally.  Skin: No rash noted.  Psychiatric: She has a normal mood and affect.    ED Course  Procedures (including critical care time) Patient informed of clinical course, understand medical decision-making process, and agree with plan. Labs Review Labs Reviewed - No data to display Imaging Review No results found.  EKG Interpretation   None       MDM   1. Lumbar strain, initial encounter    I doubt any other EMC precluding discharge at this time including, but not necessarily limited to the following:SBI, cauda equina.    Hurman Horn, MD 10/08/13 [phone]

## 2013-10-07 NOTE — ED Notes (Signed)
Pt states she hasn't had a day off work since last Saturday.  Pt states she's on her feet all day at work and has to lift heavy boxes.  Pt c/o lower back pain.  Pt denies any overt injury.

## 2013-11-16 ENCOUNTER — Encounter (HOSPITAL_COMMUNITY): Payer: Self-pay | Admitting: Emergency Medicine

## 2013-11-16 ENCOUNTER — Emergency Department (HOSPITAL_COMMUNITY)
Admission: EM | Admit: 2013-11-16 | Discharge: 2013-11-17 | Disposition: A | Discharge status: Home / Self Care | Payer: PRIVATE HEALTH INSURANCE | Attending: Emergency Medicine | Admitting: Emergency Medicine

## 2013-11-16 DIAGNOSIS — IMO0002 Reserved for concepts with insufficient information to code with codable children: Secondary | ICD-10-CM | POA: Insufficient documentation

## 2013-11-16 DIAGNOSIS — Y9229 Other specified public building as the place of occurrence of the external cause: Secondary | ICD-10-CM | POA: Insufficient documentation

## 2013-11-16 DIAGNOSIS — J45909 Unspecified asthma, uncomplicated: Secondary | ICD-10-CM | POA: Insufficient documentation

## 2013-11-16 DIAGNOSIS — Y990 Civilian activity done for income or pay: Secondary | ICD-10-CM | POA: Insufficient documentation

## 2013-11-16 DIAGNOSIS — I10 Essential (primary) hypertension: Secondary | ICD-10-CM | POA: Insufficient documentation

## 2013-11-16 DIAGNOSIS — S339XXA Sprain of unspecified parts of lumbar spine and pelvis, initial encounter: Secondary | ICD-10-CM | POA: Insufficient documentation

## 2013-11-16 DIAGNOSIS — X503XXA Overexertion from repetitive movements, initial encounter: Secondary | ICD-10-CM | POA: Insufficient documentation

## 2013-11-16 DIAGNOSIS — Z91040 Latex allergy status: Secondary | ICD-10-CM | POA: Insufficient documentation

## 2013-11-16 DIAGNOSIS — S335XXA Sprain of ligaments of lumbar spine, initial encounter: Principal | ICD-10-CM

## 2013-11-16 DIAGNOSIS — Z8619 Personal history of other infectious and parasitic diseases: Secondary | ICD-10-CM | POA: Insufficient documentation

## 2013-11-16 DIAGNOSIS — Z79899 Other long term (current) drug therapy: Secondary | ICD-10-CM | POA: Insufficient documentation

## 2013-11-16 DIAGNOSIS — S39012A Strain of muscle, fascia and tendon of lower back, initial encounter: Secondary | ICD-10-CM

## 2013-11-16 DIAGNOSIS — Y9389 Activity, other specified: Secondary | ICD-10-CM | POA: Insufficient documentation

## 2013-11-16 DIAGNOSIS — Z8742 Personal history of other diseases of the female genital tract: Secondary | ICD-10-CM | POA: Insufficient documentation

## 2013-11-16 NOTE — ED Provider Notes (Signed)
CSN: [id number]     Arrival date & time 11/16/13  2342 History  This chart was scribed for non-physician practitioner Julie NapoleonHope M Jefferie Holston, NP working with Darlys Galesavid Masneri, MD by Valera CastleSteven Perry, ED scribe. This patient was seen in room TR07C/TR07C and the patient's care was started at 11:57 PM.   Chief Complaint  Patient presents with  . Back Pain   (Consider location/radiation/quality/duration/timing/severity/associated sxs/prior Treatment) The history is provided by the patient. No language interpreter was used.   HPI Comments: [name] is a 28 y.o. female with h/o morbid obesity, who presents to the Emergency Department complaining of constant, mid, lower back pain, onset 2 days ago. She reports sharp, shooting pain with movement. She denies her back pain radiating to her LE. She reports h/o back pain, but states her recent pain has been worse than usual. She reports her h/o back pain involved her upper back. 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 She states she lifts them about 12 times a night, 6 days a week. She states she usually takes Ibuprofen for her h/o back pain with relief, but no relief after taking it the last 2 days. She denies bladder and bowel incontinence, nausea, vomiting, dysuria, urinary frequency, and any other associated symptoms. She denies known allergies to medication.   PCP - Default, Provider, MD  Past Medical History  Diagnosis Date  . Polycystic ovarian syndrome   . Asthma   . DUB (dysfunctional uterine bleeding)   . Morbid obesity   . Gonorrhea June 2013  . Genital HSV   . Hypertension     occasional htn but no meds required   Past Surgical History  Procedure Laterality Date  . Dilation and curettage of uterus  08/15/2011    Procedure: DILATATION AND CURETTAGE (D&C);  Surgeon: Leanora IvanoffMyra C. Marice Potterove, MD;  Location: [facility];  Service: Gynecology;  Laterality: N/A;  . Dilation and curettage of uterus N/A  01/17/2013    Procedure: DILATATION AND CURETTAGE;  Surgeon: Allie BossierMyra C Dove, MD;  Location: [facility];  Service: Gynecology;  Laterality: N/A;   Family History  Problem Relation Age of Onset  . Cancer Mother     ovarian  . Diabetes Mother   . Asthma Mother   . Diabetes Father   . Asthma Father   . Asthma Sister   . Asthma Brother    History  Substance Use Topics  . Smoking status: Never Smoker   . Smokeless tobacco: Never Used  . Alcohol Use: No   OB History   Grav Para Term Preterm Abortions TAB SAB Ect Mult Living   1 0 0 0 1 0 1 0 0 0      Review of Systems  Constitutional: Negative for activity change.  HENT: Negative.   Gastrointestinal: Negative for nausea, vomiting and abdominal pain.       - for bowel incontinence  Genitourinary: Negative for dysuria, urgency and frequency.       - for bladder incontinence  Musculoskeletal: Positive for back pain (lower).  Skin: Negative for wound.  Neurological: Negative for headaches.    Allergies  Latex  Home Medications   Current Outpatient Rx  Name  Route  Sig  Dispense  Refill  . albuterol (PROVENTIL HFA;VENTOLIN HFA) 108 (90 BASE) MCG/ACT inhaler   Inhalation   Inhale 2 puffs into the lungs daily as needed for wheezing or shortness of breath.          .Marland Kitchen  Aspirin-Acetaminophen-Caffeine (GOODYS EXTRA STRENGTH) 260-130-16 MG TABS   Oral   Take 1 packet by mouth daily.         Marland Kitchen HYDROcodone-acetaminophen (NORCO) 5-325 MG per tablet   Oral   Take 2 tablets by mouth every 6 (six) hours as needed for severe pain.   10 tablet   0   . triamcinolone (KENALOG) 0.025 % ointment   Topical   Apply 1 application topically 2 (two) times daily. Do not apply to face   15 g   1    BP 129/73  Pulse 102  Temp(Src) 98.5 F (36.9 C) (Oral)  Resp 16  Ht 5\' 8"  (1.727 m)  Wt 366 lb (166.017 kg)  BMI 55.66 kg/m2  SpO2 99%  Physical Exam  Nursing note and vitals reviewed. Constitutional: She is oriented to person, place, and  time. She appears well-developed and well-nourished. No distress.  HENT:  Head: Normocephalic and atraumatic.  Eyes: EOM are normal.  Neck: Normal range of motion. Neck supple.  Cardiovascular: Normal rate and regular rhythm.   Pulmonary/Chest: Effort normal. No respiratory distress.  Abdominal: Soft. There is no tenderness.  Musculoskeletal: Normal range of motion.       Lumbar back: She exhibits tenderness, pain and spasm. She exhibits normal range of motion, no deformity and normal pulse.       Back:  Neurological: She is alert and oriented to person, place, and time. She has normal strength and normal reflexes. No sensory deficit. Gait normal.  Pedal pulses equal bilateral, adequate circulation, good touch sensation.  Skin: Skin is warm and dry.  Psychiatric: She has a normal mood and affect. Her behavior is normal.    ED Course  Procedures (including critical care time)  DIAGNOSTIC STUDIES: Oxygen Saturation is 99% on room air, normal by my interpretation.    COORDINATION OF CARE: 12:03 AM-Discussed treatment plan with pt at bedside and pt agreed to plan.   MDM  28 y.o. morbidly obese female with low back pain after repetitive lifting of 75 pound boxes at work.  Will treat for pain and muscle spasm. Stable for discharge without any neurologic abnormalities. Discussed with the patient clinical findings and plan of care and all questioned fully answered. She will return if any problems arise.     Medication List    STOP taking these medications       GOODYS EXTRA STRENGTH 260-130-16 MG Tabs  Generic drug:  Aspirin-Acetaminophen-Caffeine      TAKE these medications       cyclobenzaprine 10 MG tablet  Commonly known as:  FLEXERIL  Take 1 tablet (10 mg total) by mouth 2 (two) times daily as needed for muscle spasms.     HYDROcodone-acetaminophen 5-325 MG per tablet  Commonly known as:  NORCO  Take 1 tablet by mouth every 6 (six) hours as needed for moderate pain.      naproxen 500 MG tablet  Commonly known as:  NAPROSYN  Take 1 tablet (500 mg total) by mouth 2 (two) times daily.      ASK your doctor about these medications       albuterol 108 (90 BASE) MCG/ACT inhaler  Commonly known as:  PROVENTIL HFA;VENTOLIN HFA  Inhale 2 puffs into the lungs daily as needed for wheezing or shortness of breath.     triamcinolone 0.025 % ointment  Commonly known as:  KENALOG  Apply 1 application topically 2 (two) times daily. Do not apply to face  I personally performed the services described in this documentation, which was scribed in my presence. The recorded information has been reviewed and is accurate.    [facility] Orlene Och, [place] 11/17/13 [phone]

## 2013-11-16 NOTE — ED Notes (Signed)
Neese PA at bedside.

## 2013-11-16 NOTE — ED Notes (Signed)
Lower Back pain x 2 days; onset: lifting boxes at work.

## 2013-11-17 MED ORDER — HYDROCODONE-ACETAMINOPHEN 5-325 MG PO TABS
1.0000 | ORAL_TABLET | Freq: Once | ORAL | Status: AC
  Administered 2013-11-17: 1 via ORAL
  Filled 2013-11-17: qty 1

## 2013-11-17 MED ORDER — HYDROCODONE-ACETAMINOPHEN 5-325 MG PO TABS: 1.0000 | ORAL_TABLET | Freq: Four times a day (QID) | ORAL | Status: DC | PRN

## 2013-11-17 MED ORDER — CYCLOBENZAPRINE HCL 10 MG PO TABS: 10.0000 mg | ORAL_TABLET | Freq: Two times a day (BID) | ORAL | Status: DC | PRN

## 2013-11-17 MED ORDER — NAPROXEN 500 MG PO TABS: 500.0000 mg | ORAL_TABLET | Freq: Two times a day (BID) | ORAL | Status: DC

## 2013-11-17 NOTE — ED Notes (Signed)
Pt states understanding of discharge instructions 

## 2013-11-17 NOTE — ED Provider Notes (Signed)
Medical screening examination/treatment/procedure(s) were performed by non-physician practitioner and as supervising physician I was immediately available for consultation/collaboration.  EKG Interpretation   None         Darlys Galesavid Nyeisha Goodall, MD 11/17/13 [phone]

## 2013-11-19 ENCOUNTER — Emergency Department (HOSPITAL_COMMUNITY): Payer: Self-pay

## 2013-11-19 ENCOUNTER — Emergency Department (HOSPITAL_COMMUNITY)
Admission: EM | Admit: 2013-11-19 | Discharge: 2013-11-19 | Disposition: A | Discharge status: Home / Self Care | Payer: PRIVATE HEALTH INSURANCE | Attending: Emergency Medicine | Admitting: Emergency Medicine

## 2013-11-19 ENCOUNTER — Encounter (HOSPITAL_COMMUNITY): Payer: Self-pay | Admitting: Emergency Medicine

## 2013-11-19 DIAGNOSIS — Z8742 Personal history of other diseases of the female genital tract: Secondary | ICD-10-CM | POA: Insufficient documentation

## 2013-11-19 DIAGNOSIS — M545 Low back pain, unspecified: Secondary | ICD-10-CM | POA: Insufficient documentation

## 2013-11-19 DIAGNOSIS — Z91040 Latex allergy status: Secondary | ICD-10-CM | POA: Insufficient documentation

## 2013-11-19 DIAGNOSIS — Z791 Long term (current) use of non-steroidal anti-inflammatories (NSAID): Secondary | ICD-10-CM | POA: Insufficient documentation

## 2013-11-19 DIAGNOSIS — S39012A Strain of muscle, fascia and tendon of lower back, initial encounter: Secondary | ICD-10-CM

## 2013-11-19 DIAGNOSIS — IMO0002 Reserved for concepts with insufficient information to code with codable children: Secondary | ICD-10-CM | POA: Insufficient documentation

## 2013-11-19 DIAGNOSIS — Z79899 Other long term (current) drug therapy: Secondary | ICD-10-CM | POA: Insufficient documentation

## 2013-11-19 DIAGNOSIS — I10 Essential (primary) hypertension: Secondary | ICD-10-CM | POA: Insufficient documentation

## 2013-11-19 DIAGNOSIS — J45909 Unspecified asthma, uncomplicated: Secondary | ICD-10-CM | POA: Insufficient documentation

## 2013-11-19 DIAGNOSIS — Z8619 Personal history of other infectious and parasitic diseases: Secondary | ICD-10-CM | POA: Insufficient documentation

## 2013-11-19 DIAGNOSIS — G8911 Acute pain due to trauma: Secondary | ICD-10-CM | POA: Insufficient documentation

## 2013-11-19 NOTE — ED Notes (Signed)
Per pt sts seen here recently for back pain and not getting better. sts lower back pain.

## 2013-11-19 NOTE — ED Provider Notes (Signed)
CSN: [id number]     Arrival date & time 11/19/13  1643 History  This chart was scribed for Julie Pel, PA-C, working with Julie Porter, MD by [name], ED Scribe. This patient was seen in room TR07C/TR07C and the patient's care was started at 5:19 PM.    Chief Complaint  Patient presents with  . Back Pain      Patient is a 28 y.o. female presenting with back pain. The history is provided by the patient. No language interpreter was used.  Back Pain   HPI Comments: [name] is a 28 y.o. female who presents to the Emergency Department complaining of constant, unchanged lower back pain that began three days ago. She rates the pain as a 10/10 in severity currently.The pain is worsened by walking, bending over, sitting down and standing up. She states that she is unable to lift anything secondary to pain. She states that she injured her back three days ago lifting a 75 lb crate and was seen for it in the ED two days ago. She was discharged with pain medication and muscle relaxers after being diagnosed with muscle spasms. She states she is here in the ED today for an extended work note due to the unchanged pain. She states the pain medication makes her "sleepy" but does not improve the pain. Her last dose of pain medication was three hours ago but she denies any relief from pain.    Past Medical History  Diagnosis Date  . Polycystic ovarian syndrome   . Asthma   . DUB (dysfunctional uterine bleeding)   . Morbid obesity   . Gonorrhea June 2013  . Genital HSV   . Hypertension     occasional htn but no meds required   Past Surgical History  Procedure Laterality Date  . Dilation and curettage of uterus  08/15/2011    Procedure: DILATATION AND CURETTAGE (D&C);  Surgeon: Leanora Ivanoff. Marice Potter, MD;  Location: [facility];  Service: Gynecology;  Laterality: N/A;  . Dilation and curettage of uterus N/A 01/17/2013    Procedure: DILATATION AND CURETTAGE;  Surgeon: Allie Bossier, MD;  Location: [facility];  Service: Gynecology;  Laterality: N/A;   Family History  Problem Relation Age of Onset  . Cancer Mother     ovarian  . Diabetes Mother   . Asthma Mother   . Diabetes Father   . Asthma Father   . Asthma Sister   . Asthma Brother    History  Substance Use Topics  . Smoking status: Never Smoker   . Smokeless tobacco: Never Used  . Alcohol Use: No   OB History   Grav Para Term Preterm Abortions TAB SAB Ect Mult Living   1 0 0 0 1 0 1 0 0 0      Review of Systems  Musculoskeletal: Positive for back pain.  All other systems reviewed and are negative.    Allergies  Latex  Home Medications   Current Outpatient Rx  Name  Route  Sig  Dispense  Refill  . albuterol (PROVENTIL HFA;VENTOLIN HFA) 108 (90 BASE) MCG/ACT inhaler   Inhalation   Inhale 2 puffs into the lungs daily as needed for wheezing or shortness of breath.          . cyclobenzaprine (FLEXERIL) 10 MG tablet   Oral   Take 1 tablet (10 mg total) by mouth 2 (two) times daily as needed for muscle spasms.   20 tablet   0   .  HYDROcodone-acetaminophen (NORCO) 5-325 MG per tablet   Oral   Take 1 tablet by mouth every 6 (six) hours as needed for moderate pain.   20 tablet   0   . naproxen (NAPROSYN) 500 MG tablet   Oral   Take 1 tablet (500 mg total) by mouth 2 (two) times daily.   30 tablet   0   . triamcinolone (KENALOG) 0.025 % ointment   Topical   Apply 1 application topically 2 (two) times daily. Do not apply to face   15 g   1    Triage Vitals: BP 122/78  Pulse 84  Temp(Src) 98.3 F (36.8 C)  Resp 18  SpO2 99%  Physical Exam  Nursing note and vitals reviewed. Constitutional: She is oriented to person, place, and time. She appears well-developed and well-nourished. No distress.  HENT:  Head: Normocephalic and atraumatic.  Eyes: EOM are normal.  Neck: Neck supple. No tracheal deviation present.  Cardiovascular: Normal rate.   Pulmonary/Chest: Effort normal. No respiratory distress.   Musculoskeletal: Normal range of motion.       Back:   Equal strength to bilateral lower extremities. Neurosensory function adequate to both legs. Skin color is normal. Skin is warm and moist. I see no step off deformity, no bony tenderness. Pt is able to ambulate without limp. Pain is relieved when sitting in certain positions. ROM is decreased due to pain. No crepitus, laceration, effusion, swelling.  Pulses are normal   Neurological: She is alert and oriented to person, place, and time.  Skin: Skin is warm and dry.  Psychiatric: She has a normal mood and affect. Her behavior is normal.    ED Course  Procedures (including critical care time)  DIAGNOSTIC STUDIES: Oxygen Saturation is 99% on room air, normal by my interpretation.    COORDINATION OF CARE: 5:59 PM - Will write patient out of work for a week with option to return early if back pain relieves. Recommend continued use of medication as well as rest and ice. Pt not currently taking NSAIDs for pain, these are recommended. Patient verbalizes understanding and agrees with treatment plan.    Labs Review Labs Reviewed - No data to display Imaging Review Dg Lumbar Spine Complete  11/19/2013   CLINICAL DATA:  Low back pain, lifting injury  EXAM: LUMBAR SPINE - COMPLETE 4+ VIEW  COMPARISON:  None available  FINDINGS: There is no evidence of lumbar spine fracture. Alignment is normal. Intervertebral disc spaces are maintained. Prominent bridging osteophytes seen on the left at the T11-12 level. Otherwise no significant degenerative disc disease identified. Sacrum is intact. SI joints are approximated. No paraspinous soft tissue abnormality.  IMPRESSION: No acute fracture or listhesis within the lumbar spine.   Electronically Signed   By: Rise MuBenjamin  McClintock M.D.   On: 11/19/2013 17:45    EKG Interpretation   None       MDM   Final diagnoses:  Lumbar strain    27 y.o.[name]'s  with back pain. No neurological  deficits and normal neuro exam. Patient can walk but states is painful. No loss of bowel or bladder control. No concern for cauda equina. No fever, night sweats, weight loss, h/o cancer, IVDU. RICE protocol and pain medicine indicated and discussed with patient.   Patient Plan 1. Medications: continue usual home medications  2. Treatment: rest, drink plenty of fluids, gentle stretching as discussed, alternate ice and heat  3. Follow Up: Please followup with your primary doctor for  discussion of your diagnoses and further evaluation after today's visit; if you do not have a primary care doctor use the resource guide provided to find one   Vital signs are stable at discharge. Filed Vitals:   11/19/13 1651  BP: 122/78  Pulse: 84  Temp: 98.3 F (36.8 C)  Resp: 18    Patient/guardian has voiced understanding and agreed to follow-up with the PCP or specialist.             Dorthula Matas, PA-C 11/19/13 1808

## 2013-11-19 NOTE — ED Notes (Signed)
Pt back to room from xray.

## 2013-11-19 NOTE — Discharge Instructions (Signed)
Back Pain, Adult Low back pain is very common. About 1 in 5 people have back pain.The cause of low back pain is rarely dangerous. The pain often gets better over time.About half of people with a sudden onset of back pain feel better in just 2 weeks. About 8 in 10 people feel better by 6 weeks.  CAUSES Some common causes of back pain include:  Strain of the muscles or ligaments supporting the spine.  Wear and tear (degeneration) of the spinal discs.  Arthritis.  Direct injury to the back. DIAGNOSIS Most of the time, the direct cause of low back pain is not known.However, back pain can be treated effectively even when the exact cause of the pain is unknown.Answering your caregiver's questions about your overall health and symptoms is one of the most accurate ways to make sure the cause of your pain is not dangerous. If your caregiver needs more information, he or she may order lab work or imaging tests (X-rays or MRIs).However, even if imaging tests show changes in your back, this usually does not require surgery. HOME CARE INSTRUCTIONS For many people, back pain returns.Since low back pain is rarely dangerous, it is often a condition that people can learn to [facility] their own.   Remain active. It is stressful on the back to sit or stand in one place. Do not sit, drive, or stand in one place for more than 30 minutes at a time. Take short walks on level surfaces as soon as pain allows.Try to increase the length of time you walk each day.  Do not stay in bed.Resting more than 1 or 2 days can delay your recovery.  Do not avoid exercise or work.Your body is made to move.It is not dangerous to be active, even though your back may hurt.Your back will likely heal faster if you return to being active before your pain is gone.  Pay attention to your body when you bend and lift. Many people have less discomfortwhen lifting if they bend their knees, keep the load close to their bodies,and  avoid twisting. Often, the most comfortable positions are those that put less stress on your recovering back.  Find a comfortable position to sleep. Use a firm mattress and lie on your side with your knees slightly bent. If you lie on your back, put a pillow under your knees.  Only take over-the-counter or prescription medicines as directed by your caregiver. Over-the-counter medicines to reduce pain and inflammation are often the most helpful.Your caregiver may prescribe muscle relaxant drugs.These medicines help dull your pain so you can more quickly return to your normal activities and healthy exercise.  Put ice on the injured area.  Put ice in a plastic bag.  Place a towel between your skin and the bag.  Leave the ice on for 15-20 minutes, 03-04 times a day for the first 2 to 3 days. After that, ice and heat may be alternated to reduce pain and spasms.  Ask your caregiver about trying back exercises and gentle massage. This may be of some benefit.  Avoid feeling anxious or stressed.Stress increases muscle tension and can worsen back pain.It is important to recognize when you are anxious or stressed and learn ways to manage it.Exercise is a great option. SEEK MEDICAL CARE IF:  You have pain that is not relieved with rest or medicine.  You have pain that does not improve in 1 week.  You have new symptoms.  You are generally not feeling well. SEEK  IMMEDIATE MEDICAL CARE IF:   You have pain that radiates from your back into your legs.  You develop new bowel or bladder control problems.  You have unusual weakness or numbness in your arms or le Lumbosacral Strain Lumbosacral strain is a strain of any of the parts that make up your lumbosacral vertebrae. Your lumbosacral vertebrae are the bones that make up the lower third of your backbone. Your lumbosacral vertebrae are held together by muscles and tough, fibrous tissue (ligaments).  CAUSES  A sudden blow to your back can cause  lumbosacral strain. Also, anything that causes an excessive stretch of the muscles in the low back can cause this strain. This is typically seen when people exert themselves strenuously, fall, lift heavy objects, bend, or crouch repeatedly. RISK FACTORS Physically demanding work. Participation in pushing or pulling sports or sports that require sudden twist of the back (tennis, golf, baseball). Weight lifting. Excessive lower back curvature. Forward-tilted pelvis. Weak back or abdominal muscles or both. Tight hamstrings. SIGNS AND SYMPTOMS  Lumbosacral strain may cause pain in the area of your injury or pain that moves (radiates) down your leg.  DIAGNOSIS Your health care provider can often diagnose lumbosacral strain through a physical exam. In some cases, you may need tests such as X-ray exams.  TREATMENT  Treatment for your lower back injury depends on many factors that your clinician will have to evaluate. However, most treatment will include the use of anti-inflammatory medicines. HOME CARE INSTRUCTIONS  Avoid hard physical activities (tennis, racquetball, waterskiing) if you are not in proper physical condition for it. This may aggravate or create problems. If you have a back problem, avoid sports requiring sudden body movements. Swimming and walking are generally safer activities. Maintain good posture. Maintain a healthy weight. For acute conditions, you may put ice on the injured area. Put ice in a plastic bag. Place a towel between your skin and the bag. Leave the ice on for 20 minutes, 2 3 times a day. When the low back starts healing, stretching and strengthening exercises may be recommended. SEEK MEDICAL CARE IF: Your back pain is getting worse. You experience severe back pain not relieved with medicines. SEEK IMMEDIATE MEDICAL CARE IF:  You have numbness, tingling, weakness, or problems with the use of your arms or legs. There is a change in bowel or bladder control. You  have increasing pain in any area of the body, including your belly (abdomen). You notice shortness of breath, dizziness, or feel faint. You feel sick to your stomach (nauseous), are throwing up (vomiting), or become sweaty. You notice discoloration of your toes or legs, or your feet get very cold. MAKE SURE YOU:  Understand these instructions. Will watch your condition. Will get help right away if you are not doing well or get worse. Document Released: 07/06/2005 Document Revised: 07/17/2013 Document Reviewed: 05/15/2013 [facility] Patient Information 2014 [employer].  gs.  You develop nausea or vomiting.  You develop abdominal pain.  You feel faint. Document Released: 09/26/2005 Document Revised: 03/27/2012 Document Reviewed: 02/14/2011 [facility] Patient Information 2014 [place]Care, [place].

## 2013-11-19 NOTE — ED Notes (Signed)
Patient transported to X-ray 

## 2013-11-19 NOTE — ED Notes (Addendum)
Pt seen 11-16-13 for back pain.  Note for work ended today. Needs another work note. Pain is unchanged since 11-16-13.  Some relief with laying on side.

## 2013-11-24 NOTE — ED Provider Notes (Signed)
Medical screening examination/treatment/procedure(s) were performed by non-physician practitioner and as supervising physician I was immediately available for consultation/collaboration.  EKG Interpretation   None         Rolland PorterMark Cortavious Nix, MD 11/24/13 [id number]

## 2013-12-05 ENCOUNTER — Emergency Department (HOSPITAL_COMMUNITY)
Admission: EM | Admit: 2013-12-05 | Discharge: 2013-12-05 | Disposition: A | Discharge status: Home / Self Care | Payer: PRIVATE HEALTH INSURANCE | Attending: Emergency Medicine | Admitting: Emergency Medicine

## 2013-12-05 ENCOUNTER — Encounter (HOSPITAL_COMMUNITY): Payer: Self-pay | Admitting: Emergency Medicine

## 2013-12-05 DIAGNOSIS — J45909 Unspecified asthma, uncomplicated: Secondary | ICD-10-CM | POA: Insufficient documentation

## 2013-12-05 DIAGNOSIS — IMO0002 Reserved for concepts with insufficient information to code with codable children: Secondary | ICD-10-CM | POA: Insufficient documentation

## 2013-12-05 DIAGNOSIS — Z79899 Other long term (current) drug therapy: Secondary | ICD-10-CM | POA: Insufficient documentation

## 2013-12-05 DIAGNOSIS — Z8742 Personal history of other diseases of the female genital tract: Secondary | ICD-10-CM | POA: Insufficient documentation

## 2013-12-05 DIAGNOSIS — Z91040 Latex allergy status: Secondary | ICD-10-CM | POA: Insufficient documentation

## 2013-12-05 DIAGNOSIS — Z8619 Personal history of other infectious and parasitic diseases: Secondary | ICD-10-CM | POA: Insufficient documentation

## 2013-12-05 DIAGNOSIS — S335XXA Sprain of ligaments of lumbar spine, initial encounter: Secondary | ICD-10-CM | POA: Insufficient documentation

## 2013-12-05 DIAGNOSIS — Y939 Activity, unspecified: Secondary | ICD-10-CM | POA: Insufficient documentation

## 2013-12-05 DIAGNOSIS — X58XXXA Exposure to other specified factors, initial encounter: Secondary | ICD-10-CM | POA: Insufficient documentation

## 2013-12-05 DIAGNOSIS — G8929 Other chronic pain: Secondary | ICD-10-CM | POA: Insufficient documentation

## 2013-12-05 DIAGNOSIS — S39012A Strain of muscle, fascia and tendon of lower back, initial encounter: Secondary | ICD-10-CM

## 2013-12-05 DIAGNOSIS — I10 Essential (primary) hypertension: Secondary | ICD-10-CM | POA: Insufficient documentation

## 2013-12-05 DIAGNOSIS — Z791 Long term (current) use of non-steroidal anti-inflammatories (NSAID): Secondary | ICD-10-CM | POA: Insufficient documentation

## 2013-12-05 DIAGNOSIS — Y929 Unspecified place or not applicable: Secondary | ICD-10-CM | POA: Insufficient documentation

## 2013-12-05 MED ORDER — IBUPROFEN 800 MG PO TABS: 800.0000 mg | ORAL_TABLET | Freq: Three times a day (TID) | ORAL | Status: AC

## 2013-12-05 MED ORDER — CYCLOBENZAPRINE HCL 10 MG PO TABS: 10.0000 mg | ORAL_TABLET | Freq: Two times a day (BID) | ORAL | Status: DC | PRN

## 2013-12-05 NOTE — Discharge Instructions (Signed)
Back Exercises °Back exercises help treat and prevent back injuries. The goal of back exercises is to increase the strength of your abdominal and back muscles and the flexibility of your back. These exercises should be started when you no longer have back pain. Back exercises include: °· Pelvic Tilt. Lie on your back with your knees bent. Tilt your pelvis until the lower part of your back is against the floor. Hold this position 5 to 10 sec and repeat 5 to 10 times. °· Knee to Chest. Pull first 1 knee up against your chest and hold for 20 to 30 seconds, repeat this with the other knee, and then both knees. This may be done with the other leg straight or bent, whichever feels better. °· Sit-Ups or Curl-Ups. Bend your knees 90 degrees. Start with tilting your pelvis, and do a partial, slow sit-up, lifting your trunk only 30 to 45 degrees off the floor. Take at least 2 to 3 seconds for each sit-up. Do not do sit-ups with your knees out straight. If partial sit-ups are difficult, simply do the above but with only tightening your abdominal muscles and holding it as directed. °· Hip-Lift. Lie on your back with your knees flexed 90 degrees. Push down with your feet and shoulders as you raise your hips a couple inches off the floor; hold for 10 seconds, repeat 5 to 10 times. °· Back arches. Lie on your stomach, propping yourself up on bent elbows. Slowly press on your hands, causing an arch in your low back. Repeat 3 to 5 times. Any initial stiffness and discomfort should lessen with repetition over time. °· Shoulder-Lifts. Lie face down with arms beside your body. Keep hips and torso pressed to floor as you slowly lift your head and shoulders off the floor. °Do not overdo your exercises, especially in the beginning. Exercises may cause you some mild back discomfort which lasts for a few minutes; however, if the pain is more severe, or lasts for more than 15 minutes, do not continue exercises until you see your caregiver.  Improvement with exercise therapy for back problems is slow.  °See your caregivers for assistance with developing a proper back exercise program. °Document Released: 11/03/2004 Document Revised: 12/19/2011 Document Reviewed: 07/28/2011 °[employer]® Patient Information ©2014 [employer]. ° °Back Pain, Adult °Low back pain is very common. About 1 in 5 people have back pain. The cause of low back pain is rarely dangerous. The pain often gets better over time. About half of people with a sudden onset of back pain feel better in just 2 weeks. About 8 in 10 people feel better by 6 weeks.  °CAUSES °Some common causes of back pain include: °· Strain of the muscles or ligaments supporting the spine. °· Wear and tear (degeneration) of the spinal discs. °· Arthritis. °· Direct injury to the back. °DIAGNOSIS °Most of the time, the direct cause of low back pain is not known. However, back pain can be treated effectively even when the exact cause of the pain is unknown. Answering your caregiver's questions about your overall health and symptoms is one of the most accurate ways to make sure the cause of your pain is not dangerous. If your caregiver needs more information, he or she may order lab work or imaging tests (X-rays or MRIs). However, even if imaging tests show changes in your back, this usually does not require surgery. °HOME CARE INSTRUCTIONS °For many people, back pain returns. Since low back pain is rarely dangerous, it is often a condition that people   can learn to manage on their own.  °· Remain active. It is stressful on the back to sit or stand in one place. Do not sit, drive, or stand in one place for more than 30 minutes at a time. Take short walks on level surfaces as soon as pain allows. Try to increase the length of time you walk each day. °· Do not stay in bed. Resting more than 1 or 2 days can delay your recovery. °· Do not avoid exercise or work. Your body is made to move. It is not dangerous to be active,  even though your back may hurt. Your back will likely heal faster if you return to being active before your pain is gone. °· Pay attention to your body when you  bend and lift. Many people have less discomfort when lifting if they bend their knees, keep the load close to their bodies, and avoid twisting. Often, the most comfortable positions are those that put less stress on your recovering back. °· Find a comfortable position to sleep. Use a firm mattress and lie on your side with your knees slightly bent. If you lie on your back, put a pillow under your knees. °· Only take over-the-counter or prescription medicines as directed by your caregiver. Over-the-counter medicines to reduce pain and inflammation are often the most helpful. Your caregiver may prescribe muscle relaxant drugs. These medicines help dull your pain so you can more quickly return to your normal activities and healthy exercise. °· Put ice on the injured area. °· Put ice in a plastic bag. °· Place a towel between your skin and the bag. °· Leave the ice on for 15-20 minutes, 03-04 times a day for the first 2 to 3 days. After that, ice and heat may be alternated to reduce pain and spasms. °· Ask your caregiver about trying back exercises and gentle massage. This may be of some benefit. °· Avoid feeling anxious or stressed. Stress increases muscle tension and can worsen back pain. It is important to recognize when you are anxious or stressed and learn ways to manage it. Exercise is a great option. °SEEK MEDICAL CARE IF: °· You have pain that is not relieved with rest or medicine. °· You have pain that does not improve in 1 week. °· You have new symptoms. °· You are generally not feeling well. °SEEK IMMEDIATE MEDICAL CARE IF:  °· You have pain that radiates from your back into your legs. °· You develop new bowel or bladder control problems. °· You have unusual weakness or numbness in your arms or legs. °· You develop nausea or vomiting. °· You develop  abdominal pain. °· You feel faint. °Document Released: 09/26/2005 Document Revised: 03/27/2012 Document Reviewed: 02/14/2011 °ExitCare® Patient Information ©2014 [employer]. ° °Lumbosacral Strain °Lumbosacral strain is a strain of any of the parts that make up your lumbosacral vertebrae. Your lumbosacral vertebrae are the bones that make up the lower third of your backbone. Your lumbosacral vertebrae are held together by muscles and tough, fibrous tissue (ligaments).  °CAUSES  °A sudden blow to your back can cause lumbosacral strain. Also, anything that causes an excessive stretch of the muscles in the low back can cause this strain. This is typically seen when people exert themselves strenuously, fall, lift heavy objects, bend, or crouch repeatedly. °RISK FACTORS °· Physically demanding work. °· Participation in pushing or pulling sports or sports that require sudden twist of the back (tennis, golf, baseball). °· Weight lifting. °· Excessive lower back curvature. °· Forward-tilted pelvis. °· Weak back or abdominal muscles or both. °·   Tight hamstrings. °SIGNS AND SYMPTOMS  °Lumbosacral strain may cause pain in the area of your injury or pain that moves (radiates) down your leg.  °DIAGNOSIS °Your health care provider can often diagnose lumbosacral strain through a physical exam. In some cases, you may need tests such as X-ray exams.  °TREATMENT  °Treatment for your lower back injury depends on many factors that your clinician will have to evaluate. However, most treatment will include the use of anti-inflammatory medicines. °HOME CARE INSTRUCTIONS  °· Avoid hard physical activities (tennis, racquetball, waterskiing) if you are not in proper physical condition for it. This may aggravate or create problems. °· If you have a back problem, avoid sports requiring sudden body movements. Swimming and walking are generally safer activities. °· Maintain good posture. °· Maintain a healthy weight. °· For acute conditions,  you may put ice on the injured area. °· Put ice in a plastic bag. °· Place a towel between your skin and the bag. °· Leave the ice on for 20 minutes, 2 3 times a day. °· When the low back starts healing, stretching and strengthening exercises may be recommended. °SEEK MEDICAL CARE IF: °· Your back pain is getting worse. °· You experience severe back pain not relieved with medicines. °SEEK IMMEDIATE MEDICAL CARE IF:  °· You have numbness, tingling, weakness, or problems with the use of your arms or legs. °· There is a change in bowel or bladder control. °· You have increasing pain in any area of the body, including your belly (abdomen). °· You notice shortness of breath, dizziness, or feel faint. °· You feel sick to your stomach (nauseous), are throwing up (vomiting), or become sweaty. °· You notice discoloration of your toes or legs, or your feet get very cold. °MAKE SURE YOU:  °· Understand these instructions. °· Will watch your condition. °· Will get help right away if you are not doing well or get worse. °Document Released: 07/06/2005 Document Revised: 07/17/2013 Document Reviewed: 05/15/2013 °Exit[employer]® Patient Information ©2014 [employer]. ° °

## 2013-12-05 NOTE — ED Provider Notes (Signed)
CSN: [id number]     Arrival date & time 12/05/13  1854 History  This chart was scribed for non-physician practitioner Cherrie Distance, PA-C  working with Shanna Cisco, MD by Danella Maiers, ED Scribe. This patient was seen in room TR05C/TR05C and the patient's care was started at 7:15 PM.    Chief Complaint  Patient presents with  . Back Pain   The history is provided by the patient. No language interpreter was used.   HPI Comments: [name] is a 28 y.o. female who presents to the Emergency Department complaining of chronic, non-radiating lower back pain onset one month ago that worsened 2 days ago. She states she has been seen here 2x in the last month for the same and had negative x-rays. She reports the pain worsens with straightening her spine and states she has to walk hunched over due to pain. She states she works at the [employer] and has to do a lot of heavy lifting. She denies urinary or bowel incontinence.   Past Medical History  Diagnosis Date  . Polycystic ovarian syndrome   . Asthma   . DUB (dysfunctional uterine bleeding)   . Morbid obesity   . Gonorrhea June 2013  . Genital HSV   . Hypertension     occasional htn but no meds required   Past Surgical History  Procedure Laterality Date  . Dilation and curettage of uterus  08/15/2011    Procedure: DILATATION AND CURETTAGE (D&C);  Surgeon: Leanora Ivanoff. Marice Potter, MD;  Location: [facility];  Service: Gynecology;  Laterality: N/A;  . Dilation and curettage of uterus N/A 01/17/2013    Procedure: DILATATION AND CURETTAGE;  Surgeon: Allie Bossier, MD;  Location: [facility];  Service: Gynecology;  Laterality: N/A;   Family History  Problem Relation Age of Onset  . Cancer Mother     ovarian  . Diabetes Mother   . Asthma Mother   . Diabetes Father   . Asthma Father   . Asthma Sister   . Asthma Brother    History  Substance Use Topics  . Smoking status: Never Smoker   . Smokeless tobacco: Never Used  . Alcohol Use: No   OB  History   Grav Para Term Preterm Abortions TAB SAB Ect Mult Living   1 0 0 0 1 0 1 0 0 0      Review of Systems  Musculoskeletal: Positive for back pain.  All other systems reviewed and are negative.      Allergies  Latex  Home Medications   Current Outpatient Rx  Name  Route  Sig  Dispense  Refill  . albuterol (PROVENTIL HFA;VENTOLIN HFA) 108 (90 BASE) MCG/ACT inhaler   Inhalation   Inhale 2 puffs into the lungs daily as needed for wheezing or shortness of breath.          . cyclobenzaprine (FLEXERIL) 10 MG tablet   Oral   Take 1 tablet (10 mg total) by mouth 2 (two) times daily as needed for muscle spasms.   20 tablet   0   . HYDROcodone-acetaminophen (NORCO) 5-325 MG per tablet   Oral   Take 1 tablet by mouth every 6 (six) hours as needed for moderate pain.   20 tablet   0   . naproxen (NAPROSYN) 500 MG tablet   Oral   Take 1 tablet (500 mg total) by mouth 2 (two) times daily.   30 tablet   0   . triamcinolone (KENALOG)  0.025 % ointment   Topical   Apply 1 application topically 2 (two) times daily. Do not apply to face   15 g   1    BP 130/82  Pulse 84  Temp(Src) 98.4 F (36.9 C) (Oral)  Resp 18  SpO2 100% Physical Exam  Nursing note and vitals reviewed. Constitutional: She is oriented to person, place, and time. She appears well-developed and well-nourished. No distress.  HENT:  Head: Normocephalic and atraumatic.  Mouth/Throat: Oropharynx is clear and moist.  Eyes: Conjunctivae are normal. No scleral icterus.  Pulmonary/Chest: Effort normal.  Musculoskeletal:       Cervical back: She exhibits normal range of motion, no tenderness and no bony tenderness.       Thoracic back: She exhibits normal range of motion, no tenderness and no bony tenderness.       Lumbar back: She exhibits tenderness and spasm. She exhibits normal range of motion and no bony tenderness.       Back:  Neurological: She is alert and oriented to person, place, and time. She  has normal strength. She displays no atrophy. No cranial nerve deficit or sensory deficit. She exhibits normal muscle tone. She displays a negative Romberg sign. Coordination and gait normal. GCS eye subscore is 4. GCS verbal subscore is 5. GCS motor subscore is 6.  Reflex Scores:      Patellar reflexes are 2+ on the right side and 2+ on the left side.      Achilles reflexes are 2+ on the right side and 2+ on the left side. 5/5 strength in hip flexors, quads, hamstrings.  Skin: Skin is warm and dry. No rash noted. No erythema. No pallor.  Psychiatric: She has a normal mood and affect. Her behavior is normal. Judgment and thought content normal.    ED Course  Procedures (including critical care time) Medications - No data to display  DIAGNOSTIC STUDIES: Oxygen Saturation is 100% on RA, normal by my interpretation.    COORDINATION OF CARE: 7:21 PM- Discussed treatment plan with pt which includes antiinflammatories and muscle relaxers. Pt agrees to plan.    Labs Review Labs Reviewed - No data to display Imaging Review No results found.  EKG Interpretation   None       MDM   Lumbar strain  Patient returns with continued pain in lumbar area, no alarming signs to suggest cauda equina, epidural hematoma.  Discussed with patient back sparing exercises and the importance of lifting correctly.  Will write out of work tonight.  I personally performed the services described in this documentation, which was scribed in my presence. The recorded information has been reviewed and is accurate. '    Jaymarie Yeakel C. Marisue HumbleSanford, New JerseyPA-C 12/05/13 1929

## 2013-12-05 NOTE — ED Notes (Signed)
Chronic lower back pain. Been here frequently for same issue. Been taking Vicodin and naproxen.

## 2013-12-06 NOTE — ED Provider Notes (Signed)
Medical screening examination/treatment/procedure(s) were performed by non-physician practitioner and as supervising physician I was immediately available for consultation/collaboration.   Shanna CiscoMegan E Docherty, MD 12/06/13 [phone]

## 2013-12-14 ENCOUNTER — Encounter (HOSPITAL_COMMUNITY): Payer: Self-pay | Admitting: Emergency Medicine

## 2013-12-14 DIAGNOSIS — Z791 Long term (current) use of non-steroidal anti-inflammatories (NSAID): Secondary | ICD-10-CM | POA: Insufficient documentation

## 2013-12-14 DIAGNOSIS — J45909 Unspecified asthma, uncomplicated: Secondary | ICD-10-CM | POA: Insufficient documentation

## 2013-12-14 DIAGNOSIS — Z8742 Personal history of other diseases of the female genital tract: Secondary | ICD-10-CM | POA: Insufficient documentation

## 2013-12-14 DIAGNOSIS — Z8619 Personal history of other infectious and parasitic diseases: Secondary | ICD-10-CM | POA: Insufficient documentation

## 2013-12-14 DIAGNOSIS — Z91040 Latex allergy status: Secondary | ICD-10-CM | POA: Insufficient documentation

## 2013-12-14 DIAGNOSIS — I10 Essential (primary) hypertension: Secondary | ICD-10-CM | POA: Insufficient documentation

## 2013-12-14 DIAGNOSIS — Z79899 Other long term (current) drug therapy: Secondary | ICD-10-CM | POA: Insufficient documentation

## 2013-12-14 DIAGNOSIS — Z3202 Encounter for pregnancy test, result negative: Secondary | ICD-10-CM | POA: Insufficient documentation

## 2013-12-14 DIAGNOSIS — R112 Nausea with vomiting, unspecified: Secondary | ICD-10-CM | POA: Insufficient documentation

## 2013-12-14 NOTE — ED Notes (Signed)
Pt states abdominal pain, generalized body aches. Pt denies fever. Pt states she has vomited 3 times in the last 24 hours. Pt denies diarrhea. Pt states that she has just felt bad for 2 days.

## 2013-12-15 ENCOUNTER — Emergency Department (HOSPITAL_COMMUNITY)
Admission: EM | Admit: 2013-12-15 | Discharge: 2013-12-15 | Disposition: A | Discharge status: Home / Self Care | Payer: PRIVATE HEALTH INSURANCE | Attending: Emergency Medicine | Admitting: Emergency Medicine

## 2013-12-15 DIAGNOSIS — R112 Nausea with vomiting, unspecified: Secondary | ICD-10-CM

## 2013-12-15 LAB — COMPREHENSIVE METABOLIC PANEL
ALK PHOS: 94 U/L (ref 39–117)
ALT: 15 U/L (ref 0–35)
AST: 18 U/L (ref 0–37)
Albumin: 3.5 g/dL (ref 3.5–5.2)
BILIRUBIN TOTAL: 0.4 mg/dL (ref 0.3–1.2)
BUN: 14 mg/dL (ref 6–23)
CO2: 23 mEq/L (ref 19–32)
CREATININE: 0.83 mg/dL (ref 0.50–1.10)
Calcium: 9.3 mg/dL (ref 8.4–10.5)
Chloride: 102 mEq/L (ref 96–112)
GFR calc non Af Amer: >90 mL/min (ref >90)
GLUCOSE: 82 mg/dL (ref 70–99)
POTASSIUM: 4.2 meq/L (ref 3.7–5.3)
Sodium: 138 mEq/L (ref 137–147)
TOTAL PROTEIN: 7.5 g/dL (ref 6.0–8.3)

## 2013-12-15 LAB — CBC WITH DIFFERENTIAL/PLATELET
BASOS PCT: 0 % (ref 0–1)
Basophils Absolute: 0 10*3/uL (ref 0.0–0.1)
EOS PCT: 2 % (ref 0–5)
Eosinophils Absolute: 0.1 10*3/uL (ref 0.0–0.7)
HEMATOCRIT: 35.6 % — AB (ref 36.0–46.0)
HEMOGLOBIN: 11.1 g/dL — AB (ref 12.0–15.0)
LYMPHS ABS: 2.3 10*3/uL (ref 0.7–4.0)
Lymphocytes Relative: 41 % (ref 12–46)
MCH: 21.7 pg — AB (ref 26.0–34.0)
MCHC: 31.2 g/dL (ref 30.0–36.0)
MCV: 69.5 fL — AB (ref 78.0–100.0)
MONO ABS: 0.4 10*3/uL (ref 0.1–1.0)
Monocytes Relative: 7 % (ref 3–12)
NEUTROS ABS: 2.8 10*3/uL (ref 1.7–7.7)
NEUTROS PCT: 50 % (ref 43–77)
Platelets: 338 10*3/uL (ref 150–400)
RBC: 5.12 MIL/uL — AB (ref 3.87–5.11)
RDW: 17.8 % — ABNORMAL HIGH (ref 11.5–15.5)
WBC: 5.6 10*3/uL (ref 4.0–10.5)

## 2013-12-15 LAB — POC URINE PREG, ED: Preg Test, Ur: NEGATIVE

## 2013-12-15 LAB — URINALYSIS, ROUTINE W REFLEX MICROSCOPIC
Bilirubin Urine: NEGATIVE
Glucose, UA: NEGATIVE mg/dL
Hgb urine dipstick: NEGATIVE
KETONES UR: NEGATIVE mg/dL
LEUKOCYTES UA: NEGATIVE
NITRITE: NEGATIVE
PH: 6 (ref 5.0–8.0)
PROTEIN: NEGATIVE mg/dL
Specific Gravity, Urine: 1.029 (ref 1.005–1.030)
Urobilinogen, UA: 0.2 mg/dL (ref 0.0–1.0)

## 2013-12-15 MED ORDER — DICYCLOMINE HCL 20 MG PO TABS: 20.0000 mg | ORAL_TABLET | Freq: Four times a day (QID) | ORAL | Status: DC | PRN

## 2013-12-15 MED ORDER — DICYCLOMINE HCL 10 MG PO CAPS
20.0000 mg | ORAL_CAPSULE | Freq: Once | ORAL | Status: AC
  Administered 2013-12-15: 20 mg via ORAL
  Filled 2013-12-15: qty 2

## 2013-12-15 MED ORDER — ONDANSETRON 4 MG PO TBDP
8.0000 mg | ORAL_TABLET | Freq: Once | ORAL | Status: AC
  Administered 2013-12-15: 8 mg via ORAL
  Filled 2013-12-15: qty 2

## 2013-12-15 MED ORDER — ONDANSETRON 8 MG PO TBDP: 8.0000 mg | ORAL_TABLET | Freq: Three times a day (TID) | ORAL | Status: DC | PRN

## 2013-12-15 MED ORDER — ACETAMINOPHEN 325 MG PO TABS
650.0000 mg | ORAL_TABLET | Freq: Once | ORAL | Status: AC
  Administered 2013-12-15: 650 mg via ORAL
  Filled 2013-12-15: qty 2

## 2013-12-15 NOTE — ED Provider Notes (Signed)
CSN: [id number]     Arrival date & time 12/14/13  2330 History   First MD Initiated Contact with Patient 12/15/13 [phone]     Chief Complaint  Patient presents with  . Abdominal Pain  . Generalized Body Aches     (Consider location/radiation/quality/duration/timing/severity/associated sxs/prior Treatment) HPI 28 year old female with 2 days of generalized myalgias, nausea, vomiting, without diarrhea, diffuse, crampy, abdominal pain.  She denies any fever, chills, no known sick contacts, no unusual foods.  No urinary symptoms, no vaginal symptoms, no new sexual partners. Past Medical History  Diagnosis Date  . Polycystic ovarian syndrome   . Asthma   . DUB (dysfunctional uterine bleeding)   . Morbid obesity   . Gonorrhea June 2013  . Genital HSV   . Hypertension     occasional htn but no meds required   Past Surgical History  Procedure Laterality Date  . Dilation and curettage of uterus  08/15/2011    Procedure: DILATATION AND CURETTAGE (D&C);  Surgeon: Leanora IvanoffMyra C. Marice Potterove, MD;  Location: [facility];  Service: Gynecology;  Laterality: N/A;  . Dilation and curettage of uterus N/A 01/17/2013    Procedure: DILATATION AND CURETTAGE;  Surgeon: Allie BossierMyra C Dove, MD;  Location: [facility];  Service: Gynecology;  Laterality: N/A;   Family History  Problem Relation Age of Onset  . Cancer Mother     ovarian  . Diabetes Mother   . Asthma Mother   . Diabetes Father   . Asthma Father   . Asthma Sister   . Asthma Brother    History  Substance Use Topics  . Smoking status: Never Smoker   . Smokeless tobacco: Never Used  . Alcohol Use: No   OB History   Grav Para Term Preterm Abortions TAB SAB Ect Mult Living   1 0 0 0 1 0 1 0 0 0      Review of Systems   See History of Present Illness; otherwise all other systems are reviewed and negative  Allergies  Latex  Home Medications   Current Outpatient Rx  Name  Route  Sig  Dispense  Refill  . albuterol (PROVENTIL HFA;VENTOLIN HFA) 108 (90 BASE) MCG/ACT  inhaler   Inhalation   Inhale 2 puffs into the lungs daily as needed for wheezing or shortness of breath.          . cyclobenzaprine (FLEXERIL) 10 MG tablet   Oral   Take 1 tablet (10 mg total) by mouth 2 (two) times daily as needed for muscle spasms.   20 tablet   0   . cyclobenzaprine (FLEXERIL) 10 MG tablet   Oral   Take 1 tablet (10 mg total) by mouth 2 (two) times daily as needed for muscle spasms.   20 tablet   0   . dicyclomine (BENTYL) 20 MG tablet   Oral   Take 1 tablet (20 mg total) by mouth every 6 (six) hours as needed for spasms (for abdominal cramping).   20 tablet   0   . ibuprofen (ADVIL,MOTRIN) 800 MG tablet   Oral   Take 1 tablet (800 mg total) by mouth 3 (three) times daily.   21 tablet   0   . naproxen (NAPROSYN) 500 MG tablet   Oral   Take 500 mg by mouth daily as needed for moderate pain.         Marland Kitchen. ondansetron (ZOFRAN ODT) 8 MG disintegrating tablet   Oral   Take 1 tablet (8 mg total) by mouth  every 8 (eight) hours as needed for nausea or vomiting.   20 tablet   0    BP 140/82  Pulse 86  Temp(Src) 98.8 F (37.1 C) (Oral)  Resp 16  Ht 5\' 7"  (1.702 m)  Wt 367 lb 2 oz (166.527 kg)  BMI 57.49 kg/m2  SpO2 100% Physical Exam  Nursing note and vitals reviewed. Constitutional: She is oriented to person, place, and time. She appears well-developed and well-nourished. No distress.  HENT:  Head: Normocephalic and atraumatic.  Nose: Nose normal.  Mouth/Throat: Oropharynx is clear and moist.  Eyes: Conjunctivae and EOM are normal. Pupils are equal, round, and reactive to light.  Neck: Normal range of motion. Neck supple. No JVD present. No tracheal deviation present. No thyromegaly present.  Cardiovascular: Normal rate, regular rhythm, normal heart sounds and intact distal pulses.  Exam reveals no gallop and no friction rub.   No murmur heard. Pulmonary/Chest: Effort normal and breath sounds normal. No stridor. No respiratory distress. She has  no wheezes. She has no rales. She exhibits no tenderness.  Abdominal: Soft. Bowel sounds are normal. She exhibits no distension and no mass. There is tenderness (mild diffuse). There is no rebound and no guarding.  Musculoskeletal: Normal range of motion. She exhibits no edema and no tenderness.  Lymphadenopathy:    She has no cervical adenopathy.  Neurological: She is alert and oriented to person, place, and time. She exhibits normal muscle tone. Coordination normal.  Skin: Skin is warm and dry. No rash noted. No erythema. No pallor.  Psychiatric: She has a normal mood and affect. Her behavior is normal. Judgment and thought content normal.    ED Course  Procedures (including critical care time) Labs Review Labs Reviewed  CBC WITH DIFFERENTIAL - Abnormal; Notable for the following:    RBC 5.12 (*)    Hemoglobin 11.1 (*)    HCT 35.6 (*)    MCV 69.5 (*)    MCH 21.7 (*)    RDW 17.8 (*)    All other components within normal limits  COMPREHENSIVE METABOLIC PANEL  URINALYSIS, ROUTINE W REFLEX MICROSCOPIC  POC URINE PREG, ED   Imaging Review No results found.   EKG Interpretation None      MDM   Final diagnoses:  Nausea & vomiting    28 year old female with 24 hours of nausea and vomiting, myalgias.  Suspect a gastroenteritis, possible viral syndrome.  Labs are reassuring.  Plan for ODT Zofran, Bentyl, and followup with primary  care Dr.  Outpatient resources given.    Olivia Mackie, MD 12/15/13 [phone]

## 2013-12-15 NOTE — Discharge Instructions (Signed)
Stick to a bland diet until feeling better.  Take medications as needed for abdominal cramping and/or nausea.  Return to the ER for worsening condition or new concerning symptoms.   Nausea and Vomiting Nausea is a sick feeling that often comes before throwing up (vomiting). Vomiting is a reflex where stomach contents come out of your mouth. Vomiting can cause severe loss of body fluids (dehydration). Children and elderly adults can become dehydrated quickly, especially if they also have diarrhea. Nausea and vomiting are symptoms of a condition or disease. It is important to find the cause of your symptoms. CAUSES   Direct irritation of the stomach lining. This irritation can result from increased acid production (gastroesophageal reflux disease), infection, food poisoning, taking certain medicines (such as nonsteroidal anti-inflammatory drugs), alcohol use, or tobacco use.  Signals from the brain.These signals could be caused by a headache, heat exposure, an inner ear disturbance, increased pressure in the brain from injury, infection, a tumor, or a concussion, pain, emotional stimulus, or metabolic problems.  An obstruction in the gastrointestinal tract (bowel obstruction).  Illnesses such as diabetes, hepatitis, gallbladder problems, appendicitis, kidney problems, cancer, sepsis, atypical symptoms of a heart attack, or eating disorders.  Medical treatments such as chemotherapy and radiation.  Receiving medicine that makes you sleep (general anesthetic) during surgery. DIAGNOSIS Your caregiver may ask for tests to be done if the problems do not improve after a few days. Tests may also be done if symptoms are severe or if the reason for the nausea and vomiting is not clear. Tests may include:  Urine tests.  Blood tests.  Stool tests.  Cultures (to look for evidence of infection).  X-rays or other imaging studies. Test results can help your caregiver make decisions about treatment or  the need for additional tests. TREATMENT You need to stay well hydrated. Drink frequently but in small amounts.You may wish to drink water, sports drinks, clear broth, or eat frozen ice pops or gelatin dessert to help stay hydrated.When you eat, eating slowly may help prevent nausea.There are also some antinausea medicines that may help prevent nausea. HOME CARE INSTRUCTIONS   Take all medicine as directed by your caregiver.  If you do not have an appetite, do not force yourself to eat. However, you must continue to drink fluids.  If you have an appetite, eat a normal diet unless your caregiver tells you differently.  Eat a variety of complex carbohydrates (rice, wheat, potatoes, bread), lean meats, yogurt, fruits, and vegetables.  Avoid high-fat foods because they are more difficult to digest.  Drink enough water and fluids to keep your urine clear or pale yellow.  If you are dehydrated, ask your caregiver for specific rehydration instructions. Signs of dehydration may include:  Severe thirst.  Dry lips and mouth.  Dizziness.  Dark urine.  Decreasing urine frequency and amount.  Confusion.  Rapid breathing or pulse. SEEK IMMEDIATE MEDICAL CARE IF:   You have blood or brown flecks (like coffee grounds) in your vomit.  You have black or bloody stools.  You have a severe headache or stiff neck.  You are confused.  You have severe abdominal pain.  You have chest pain or trouble breathing.  You do not urinate at least once every 8 hours.  You develop cold or clammy skin.  You continue to vomit for longer than 24 to 48 hours.  You have a fever. MAKE SURE YOU:   Understand these instructions.  Will watch your condition.  Will get  help right away if you are not doing well or get worse. Document Released: 09/26/2005 Document Revised: 12/19/2011 Document Reviewed: 02/23/2011 [facility] Patient Information 2014 [place], [place].   Emergency Department Resource  Guide 1) Find a Doctor and Pay Out of Pocket Although you won't have to find out who is covered by your insurance plan, it is a good idea to ask around and get recommendations. You will then need to call the office and see if the doctor you have chosen will accept you as a new patient and what types of options they offer for patients who are self-pay. Some doctors offer discounts or will set up payment plans for their patients who do not have insurance, but you will need to ask so you aren't surprised when you get to your appointment.  2) Contact Your Local Health Department Not all health departments have doctors that can see patients for sick visits, but many do, so it is worth a call to see if yours does. If you don't know where your local health department is, you can check in your phone book. The CDC also has a tool to help you locate your state's health department, and many state websites also have listings of all of their local health departments.  3) Find a Walk-in Clinic If your illness is not likely to be very severe or complicated, you may want to try a walk in clinic. These are popping up all over the country in pharmacies, drugstores, and shopping centers. They're usually staffed by nurse practitioners or physician assistants that have been trained to treat common illnesses and complaints. They're usually fairly quick and inexpensive. However, if you have serious medical issues or chronic medical problems, these are probably not your best option.  No Primary Care Doctor: - Call Health Connect at  [phone] - they can help you locate a primary care doctor that  accepts your insurance, provides certain services, etc. - Physician Referral Service- [phone]  Chronic Pain Problems: Organization         Address  Phone   Notes  [facility]  [phone] Patients need to be referred by their primary care doctor.   Medication Assistance: Organization          Address  Phone   Notes  [facility] [address] [place] [place], [place] [address] [phone] --Must be a resident of [facility] -- Must have NO insurance coverage whatsoever (no Medicaid/ Medicare, etc.) -- The pt. MUST have a primary care doctor that directs their care regularly and follows them in the community   [facility]  [phone]   [employer]  [phone]    Agencies that provide inexpensive medical care: Organization         Address  Phone   Notes  [facility]  [phone]   [facility]    [phone]   [facility] [address] [place], [place] [address] [phone]   [facility] [place] [address] [place], [place] [phone]   Planned Parenthood    [phone]   [facility]    [phone]   [facility]  [address], [place] Phone:  [phone], Fax:  [phone] Hours of Operation:  9 am - 6 pm, M-F.  Also accepts Medicaid/Medicare and self-pay.  [facility]  [address] [place],  [address], [place] Phone: [phone]) [phone], Fax: [phone]) [phone]. Hours of Operation:  8:30 am - 5:30 pm, M-F.  Also accepts Medicaid and self-pay.  [facility] [place] [address], [place] Phone: [phone]) [phone]   [facility] [address], [place], [place] [phone], Ext. [phone] Mondays & Thursdays: 7-9 AM.  First 15 patients are seen on a first come, first serve basis.    Medicaid-accepting [facility] Providers:  Organization         Address  Phone   Notes  [facility] [address], [place] [phone]) [phone] Also accepts self-pay patients.  [facility] [address] [place], [place]  [phone]   [facility] [address], [place] [phone]) [phone]   [facility] [address] [place] [address], [place] [phone]) [phone]   Renaye RakersVeita Bland [address], [place]   [phone] Only accepts [place] Access [place] patients after they have their name applied to their card.   Self-Pay (no insurance) in [place]:  Organization         Address  Phone   Notes  Sickle Cell Patients, [facility] [address], [place] [phone]) [phone]   [facility] [address], [place] [phone]) [phone]   [facility] [place]  [address], [place] [phone]   [facility]/Dr. Osei-Bonsu  [address] [place] [address], [place] or [phone], [place] [phone]) [phone] Phone number for both [place] and [place] locations is the same.  [facility] [place] [facility] [address], [place] [phone](336) [phone]   [facility] [address] [place] [address], [place] or [address] [phone]   [facility] [address], [place] [phone], phone; [phone](336) [phone], fax Sees patients 1st and 3rd Saturday of every month.  Must not qualify for public or private insurance (i.e. Medicaid, Medicare, [place] Health Choice, Veterans' Benefits)  Household income should be no more than 200% of the poverty level The clinic cannot treat you if you are pregnant or think you are pregnant  Sexually transmitted diseases are not treated at the clinic.    Dental Care: Organization         Address  Phone  Notes  [facility] of [facility] [address], [place] [phone]) [phone] Accepts children up to age 26 who are enrolled in [place] or [place] Health Choice; pregnant women with a Medicaid card; and children who have applied for Medicaid or [place] Health Choice, but were declined, whose parents can pay a reduced fee at time of service.  [facility]  [place]  [address], [place] [phone]) [phone] Accepts children up to age 26 who are enrolled in [place] or [place] Health Choice; pregnant women with a Medicaid card; and children who have applied for Medicaid or [place] Health Choice, but were declined, whose parents can pay a reduced fee at time of service.  [facility]  [address], [place] [phone]) [phone] Patients are seen by appointment only. Walk-ins are not accepted. [facility] will see patients 26 years of age and older. Monday - Tuesday (8am-5pm) Most Wednesdays (8:30-5pm) $30 per visit, cash only  [facility]  [address], [place] [phone] Patients are seen by appointment only. Walk-ins are not accepted. [place] will see patients 60 years of age and older. One Wednesday Evening (Monthly: Volunteer Based).  $30 per visit, cash only  [place]  [phone] for adults; Children under age 110, call Graduate Pediatric Dentistry at [phone]. Children aged 96-14, please call [phone] to request a pediatric application.  Dental services are provided in all areas of dental care including fillings, crowns and bridges, complete and partial dentures, implants, gum treatment, root canals, and extractions. Preventive care is also provided. Treatment is provided to both adults and children. Patients are selected via a lottery and there is often a waiting list.   [facility] [address], [place]  [phone] [email or web address]   [facility] [address] [place], [place] [phone], Ext. [phone] Second and Fourth Thursday of each month, opens at 6:30 AM; Clinic ends at 9 AM.  Patients are seen on a first-come first-served basis, and a limited number are seen during each clinic.   [facility]  [address] Hillard Danker [place], [place] [phone]   Eligibility Requirements You must  have lived in [place], [place], or [place] counties for at least the last three months.   You cannot be eligible for state or federal sponsored [employer], including [employer], [place], or [employer].   You generally cannot be eligible for healthcare insurance through your employer.    How to apply: Eligibility screenings are held every Tuesday and Wednesday afternoon from 1:00 pm until 4:00 pm. You do not need an appointment for the interview!  [facility] [address] [place], [place]   [place] Department  [place]  [phone]    Behavioral Health Resources in the Community: Intensive Outpatient Programs Organization         Address  Phone  Notes  [place] [facility] [place]. [address], [place], [place] [phone]   [facility] [address] [place], [place]   ADS: Alcohol & Drug Svcs [address], [place], [place]   [place] [address]  [place], [place] or [phone]   Substance Abuse Resources Organization         Address  Phone  Notes  Alcohol and Drug Services  [phone]   [place]  [phone]   [facility] [place]   Chinita Pester  [phone]   Residential & Outpatient Substance Abuse Program  [phone]   Psychological Services Organization         Address  Phone  Notes  [facility] [place]  [place]  [phone]   [place] [address] [place] or [phone]    Mobile Crisis Teams Organization         Address  Phone  Notes  [facility], Mobile Crisis Care Unit  [phone]   [facility]  [address] [place], [place]   [place] Levels [address] [place] [phone]    Self-Help/Support Groups Organization         Address  Phone             Notes  [place]. of [place] -  variety of support groups  [phone] Call for more information  Narcotics Anonymous (NA), [facility] [address], [place]  2 meetings at this location   Residential Sports administratorTreatment Programs Organization         Address  Phone  Notes  [facility] [address],    [place]  [phone]   [facility]  [address], [place] [phone], [place], [place] [phone]   [facility] [address], [place] [phone] Admissions: 8am-3pm M-F  [facility] [address]    [place], [place] [phone]   [facility] [address], [place], [place] [phone]   [facility] [address]  [place], [place] [phone]   [facility] - Intensive Outpatient [address], [place], [place] [phone]   [facility] [address]  [place], [place] [phone] or [phone]   [facility] (RT[facility]) [address] [place], [place] [phone] Accepts Medicaid  [facility] [address]  [place] [phone] Substance Abuse/Addiction Treatment   [facility] Behavioral Health Resources Organization         Address  Phone  Notes  [facility]  [phone]   Angie FavaJulie Brannon, PhD [address], [address] [place], [place]   [phone] or [phone]) [phone]   [facility]   [address] [place], [place] [phone]) [phone]   [facility] [address], [place], [place] [phone]) [phone] Insurance/Medicaid/sponsorship through [facility]  Faith and Families [address]                                    [place], [place] [phone]) [phone] Therapy/tele-psych/case  [facility] [address].    [place], [place] [phone]    Dr. Lolly MustacheArfeen  [phone]) [phone]   [facility] [place]  [employer] [facility] 1) [address], [place] 2) [address], [place] 3)  [address], [place] [phone]  [phone](336) [phone]   [facility] Child Abuse Hotline [phone]) [phone] or [phone]) [phone] (After Hours)

## 2014-02-10 ENCOUNTER — Ambulatory Visit (INDEPENDENT_AMBULATORY_CARE_PROVIDER_SITE_OTHER): Payer: 59 | Admitting: Obstetrics & Gynecology

## 2014-02-10 ENCOUNTER — Encounter: Payer: Self-pay | Admitting: Obstetrics & Gynecology

## 2014-02-10 VITALS — BP 137/92 | HR 103 | Temp 98.0°F | Ht 68.0 in | Wt 367.4 lb

## 2014-02-10 DIAGNOSIS — E282 Polycystic ovarian syndrome: Secondary | ICD-10-CM

## 2014-02-10 DIAGNOSIS — N926 Irregular menstruation, unspecified: Secondary | ICD-10-CM

## 2014-02-10 LAB — POCT PREGNANCY, URINE: Preg Test, Ur: NEGATIVE

## 2014-02-10 MED ORDER — MEDROXYPROGESTERONE ACETATE 10 MG PO TABS: 20.0000 mg | ORAL_TABLET | Freq: Every day | ORAL | Status: DC

## 2014-02-10 NOTE — Progress Notes (Signed)
Pt. Here today because she does not have regular periods and she and her husband would like to get pregnant. Last period was August 2014.

## 2014-02-10 NOTE — Progress Notes (Signed)
Subjective:     Patient ID: [name], female   DOB: [birth year], 28 y.o.   MRN: [id number]  HPI Pt presents with c/o LMP of 05/2013. She reports that she has been diagnosed with PCOS.  She reports that in the past she has not had a let down bleed after being on Provera but, she does not know what dose or length of time she was on the meds.  She reports that she has been told that she is 'overweight' but she feels 'healthy'.  She reports that she has been told about exercise but, she does not like to exercise because it exacerbates her asthma.  She is interested in becoming pregnant and declines a OCP to regulate her cycle.  Review of Systems     Objective:   Physical Exam BP 137/92  Pulse 103  Temp(Src) 98 F (36.7 C) (Oral)  Ht 5\' 8"  (1.727 m)  Wt 367 lb 6.4 oz (166.652 kg)  BMI 55.88 kg/m2  UPT- negative     Assessment:     PCOS- pt offered a trial of OCP's- she declined  Morbid obesity- reviewed with pt basic fundamentals of weight loss    Plan:     Labs: HbA1c, TSH, FSH Exercise 6x/week Provera 20mg  10day F/u in 3 months or sooner prn no menses after Provera

## 2014-02-10 NOTE — Patient Instructions (Signed)
Polycystic Ovarian Syndrome Polycystic ovarian syndrome (PCOS) is a common hormonal disorder among women of reproductive age. Most women with PCOS grow many small cysts on their ovaries. PCOS can cause problems with your periods and make it difficult to get pregnant. It can also cause an increased risk of miscarriage with pregnancy. If left untreated, PCOS can lead to serious health problems, such as diabetes and heart disease. CAUSES The cause of PCOS is not fully understood, but genetics may be a factor. SIGNS AND SYMPTOMS   Infrequent or no menstrual periods.   Inability to get pregnant (infertility) because of not ovulating.   Increased growth of hair on the face, chest, stomach, back, thumbs, thighs, or toes.   Acne, oily skin, or dandruff.   Pelvic pain.   Weight gain or obesity, usually carrying extra weight around the waist.   Type 2 diabetes.   High cholesterol.   High blood pressure.   Female-pattern baldness or thinning hair.   Patches of thickened and dark brown or black skin on the neck, arms, breasts, or thighs.   Tiny excess flaps of skin (skin tags) in the armpits or neck area.   Excessive snoring and having breathing stop at times while asleep (sleep apnea).   Deepening of the voice.   Gestational diabetes when pregnant.  DIAGNOSIS  There is no single test to diagnose PCOS.   Your health care provider will:   Take a medical history.   Perform a pelvic exam.   Have ultrasonography done.   Check your female and female hormone levels.   Measure glucose or sugar levels in the blood.   Do other blood tests.   If you are producing too many female hormones, your health care provider will make sure it is from PCOS. At the physical exam, your health care provider will want to evaluate the areas of increased hair growth. Try to allow natural hair growth for a few days before the visit.   During a pelvic exam, the ovaries may be enlarged  or swollen because of the increased number of small cysts. This can be seen more easily by using vaginal ultrasonography or screening to examine the ovaries and lining of the uterus (endometrium) for cysts. The uterine lining may become thicker if you have not been having a regular period.  TREATMENT  Because there is no cure for PCOS, it needs to be managed to prevent problems. Treatments are based on your symptoms. Treatment is also based on whether you want to have a baby or whether you need contraception.  Treatment may include:   Progesterone hormone to start a menstrual period.   Birth control pills to make you have regular menstrual periods.   Medicines to make you ovulate, if you want to get pregnant.   Medicines to control your insulin.   Medicine to control your blood pressure.   Medicine and diet to control your high cholesterol and triglycerides in your blood.  Medicine to reduce excessive hair growth.  Surgery, making small holes in the ovary, to decrease the amount of female hormone production. This is done through a long, lighted tube (laparoscope) placed into the pelvis through a tiny incision in the lower abdomen.  HOME CARE INSTRUCTIONS  Only take over-the-counter or prescription medicine as directed by your health care provider.  Pay attention to the foods you eat and your activity levels. This can help reduce the effects of PCOS.  Keep your weight under control.  Eat foods that are   low in carbohydrate and high in fiber.  Exercise regularly. SEEK MEDICAL CARE IF:  Your symptoms do not get better with medicine.  You have new symptoms. Document Released: 01/20/2005 Document Revised: 07/17/2013 Document Reviewed: 03/14/2013 [employer] Patient Information 2014 [employer].  

## 2014-02-11 ENCOUNTER — Telehealth: Payer: Self-pay

## 2014-02-11 LAB — HEMOGLOBIN A1C
HEMOGLOBIN A1C: 5.6 % (ref <5.7)
Mean Plasma Glucose: 114 mg/dL (ref <117)

## 2014-02-11 LAB — TSH: TSH: 2.709 u[IU]/mL (ref 0.350–4.500)

## 2014-02-11 LAB — FOLLICLE STIMULATING HORMONE: FSH: 6.6 m[IU]/mL

## 2014-02-11 NOTE — Telephone Encounter (Signed)
Message copied by Louanna RawAMPBELL, Mohd Clemons M on Tue Feb 11, 2014 10:43 AM ------      Message from: Willodean RosenthalHARRAWAY-SMITH, CAROLYN      Created: Tue Feb 11, 2014 10:31 AM       Please call pt.  All of her labs from yesterday were normal.  This still suggests PCOS (abnormal labs would suggest ANOTHER diagnosis).            She should f/u as scheduled.            Reinforce the need for exercise.            Thx,      clh-S  ------

## 2014-02-11 NOTE — Telephone Encounter (Signed)
Called pt. And informed her of results. Pt. Verbalized understanding and states she will try the provera and per Dr. Erin FullingHarraway-Smith call if she does not have a period. Encouraged pt. excercise regularly. Pt. Verbalized understanding. No questions or concerns.

## 2014-02-22 ENCOUNTER — Encounter: Payer: Self-pay | Admitting: *Deleted

## 2014-03-12 ENCOUNTER — Telehealth: Payer: Self-pay

## 2014-03-12 NOTE — Telephone Encounter (Signed)
Patient called stating she was told to schedule an appointment if she does not bleed after taking Provera. Informed patient she does need to come in and appointment schedule for April 10, 2014 at 1315. Patient states she is cramping like a period but not bleeding. Informed patient to try ibuprofen or motrin for cramping and to call clinic with any other questions or concerns. Patient verbalized understanding. No further questions or concerns.

## 2014-03-13 ENCOUNTER — Ambulatory Visit: Payer: 59 | Admitting: Advanced Practice Midwife

## 2014-04-10 ENCOUNTER — Encounter: Payer: Self-pay | Admitting: Obstetrics & Gynecology

## 2014-04-10 ENCOUNTER — Ambulatory Visit (INDEPENDENT_AMBULATORY_CARE_PROVIDER_SITE_OTHER): Payer: Self-pay | Admitting: Obstetrics & Gynecology

## 2014-04-10 VITALS — BP 151/98 | HR 92 | Temp 97.6°F | Ht 68.0 in | Wt 383.3 lb

## 2014-04-10 DIAGNOSIS — N912 Amenorrhea, unspecified: Secondary | ICD-10-CM

## 2014-04-10 LAB — POCT PREGNANCY, URINE: PREG TEST UR: NEGATIVE

## 2014-04-10 MED ORDER — MEDROXYPROGESTERONE ACETATE 10 MG PO TABS: 40.0000 mg | ORAL_TABLET | Freq: Every day | ORAL | Status: DC

## 2014-04-10 NOTE — Progress Notes (Signed)
Subjective:     Patient ID: [name], female   DOB: 1985-12-08, 28 y.o.   MRN: [id number]  HPI LMP 03/2013.  Pt did not have let down bleed with Provera.  She did not call because she did not have a phone. She has recently lost her job. Her husband has lost his job and they have moved in with his parents.  Her car/truck  has also been taken due to inability to pay the bills.  She does report having a job interview coming up next week.  Thinks her weight gain is due to the stress of the aforementioned. She is sexually active daily and is attempting to get pregnant.   Review of Systems     Objective:   Physical Exam BP 151/98  Pulse 92  Temp(Src) 97.6 F (36.4 C) (Oral)  Ht 5\' 8"  (1.727 m)  Wt 383 lb 4.8 oz (173.864 kg)  BMI 58.29 kg/m2 Pt in NAD Exam deferred Weight gain ~18pounds since last visit     Assessment:     Secondary amenorrhea- no withdrawal bleed in 20mg  daily of Provera.  Will increases the dosage.     Plan:     UPT today Provera 40mg  po x daily for 10 day to attempt to induce a withdrawal bleed

## 2014-04-10 NOTE — Patient Instructions (Signed)
Exercise to Lose Weight Exercise and a healthy diet may help you lose weight. Your doctor may suggest specific exercises. EXERCISE IDEAS AND TIPS  Choose low-cost things you enjoy doing, such as walking, bicycling, or exercising to workout videos.  Take stairs instead of the elevator.  Walk during your lunch break.  Park your car further away from work or school.  Go to a gym or an exercise class.  Start with 5 to 10 minutes of exercise each day. Build up to 30 minutes of exercise 4 to 6 days a week.  Wear shoes with good support and comfortable clothes.  Stretch before and after working out.  Work out until you breathe harder and your heart beats faster.  Drink extra water when you exercise.  Do not do so much that you hurt yourself, feel dizzy, or get very short of breath. Exercises that burn about 150 calories:  Running 1  miles in 15 minutes.  Playing volleyball for 45 to 60 minutes.  Washing and waxing a car for 45 to 60 minutes.  Playing touch football for 45 minutes.  Walking 1  miles in 35 minutes.  Pushing a stroller 1  miles in 30 minutes.  Playing basketball for 30 minutes.  Raking leaves for 30 minutes.  Bicycling 5 miles in 30 minutes.  Walking 2 miles in 30 minutes.  Dancing for 30 minutes.  Shoveling snow for 15 minutes.  Swimming laps for 20 minutes.  Walking up stairs for 15 minutes.  Bicycling 4 miles in 15 minutes.  Gardening for 30 to 45 minutes.  Jumping rope for 15 minutes.  Washing windows or floors for 45 to 60 minutes. Document Released: 10/29/2010 Document Revised: 12/19/2011 Document Reviewed: 10/29/2010 [facility] Patient Information 2015 [place], [place]. This information is not intended to replace advice given to you by your health care provider. Make sure you discuss any questions you have with your health care provider. Secondary Amenorrhea  Secondary amenorrhea is the stopping of menstrual flow for 3-6 months in a  female who has previously had periods. There are many possible causes. Most of these causes are not serious. Usually, treating the underlying problem causing the loss of menses will return your periods to normal. CAUSES  Some common and uncommon causes of not menstruating include:  Malnutrition.  Low blood sugar (hypoglycemia).  Polycystic ovary disease.  Stress or fear.  Breastfeeding.  Hormone imbalance.  Ovarian failure.  Medicines.  Extreme obesity.  Cystic fibrosis.  Low body weight or drastic weight reduction from any cause.  Early menopause.  Removal of ovaries or uterus.  Contraceptives.  Illness.  Long-term (chronic) illnesses.  Cushing syndrome.  Thyroid problems.  Birth control pills, patches, or vaginal rings for birth control. RISK FACTORS You may be at greater risk of secondary amenorrhea if:  You have a family history of this condition.  You have an eating disorder.  You do athletic training. DIAGNOSIS  A diagnosis is made by your health care provider taking a medical history and doing a physical exam. This will include a pelvic exam to check for problems with your reproductive organs. Pregnancy must be ruled out. Often, numerous blood tests are done to measure different hormones in the body. Urine testing may be done. Specialized exams (ultrasound, CT scan, MRI, or hysteroscopy) may have to be done as well as measuring the body mass index (BMI). TREATMENT  Treatment depends on the cause of the amenorrhea. If an eating disorder is present, this can be treated  with an adequate diet and therapy. Chronic illnesses may improve with treatment of the illness. Amenorrhea may be corrected with medicines, lifestyle changes, or surgery. If the amenorrhea cannot be corrected, it is sometimes possible to create a false menstruation with medicines. HOME CARE INSTRUCTIONS  Maintain a healthy diet.  Manage weight problems.  Exercise regularly but not  excessively.  Get adequate sleep.  Manage stress.  Be aware of changes in your menstrual cycle. Keep a record of when your periods occur. Note the date your period starts, how long it lasts, and any problems. SEEK MEDICAL CARE IF: Your symptoms do not get better with treatment. Document Released: 11/07/2006 Document Revised: 05/29/2013 Document Reviewed: 03/14/2013 [facility] Patient Information 2015 [place], [place]. This information is not intended to replace advice given to you by your health care provider. Make sure you discuss any questions you have with your health care provider.

## 2014-04-10 NOTE — Progress Notes (Signed)
patient has gained 16 lbs since last visit.

## 2014-04-15 ENCOUNTER — Ambulatory Visit (HOSPITAL_COMMUNITY)
Admission: RE | Admit: 2014-04-15 | Discharge: 2014-04-15 | Disposition: A | Discharge status: Home / Self Care | Payer: PRIVATE HEALTH INSURANCE | Source: Ambulatory Visit | Attending: Obstetrics & Gynecology | Admitting: Obstetrics & Gynecology

## 2014-04-15 DIAGNOSIS — N912 Amenorrhea, unspecified: Secondary | ICD-10-CM | POA: Insufficient documentation

## 2014-05-06 ENCOUNTER — Telehealth: Payer: Self-pay | Admitting: General Practice

## 2014-05-06 NOTE — Telephone Encounter (Signed)
Patient called and left message stating she was given provera to take four times a day and she has finished the bottle but still hasn't got her period and is just having bad cramping and she would like follow up as to what the next steps are for her. Message sent to Dr Erin FullingHarraway Smith. Called patient, no answer- left message stating we are trying to return your phone call and that we have messaged her Dr to see what the next steps are and we will call her back when we hear from the doctor.

## 2014-06-02 NOTE — Telephone Encounter (Signed)
Called patient who states she has still not had a period. Patient has appointment 06/13/14 at 1030 with Dr. Erin Fulling-- to discuss options at that time. Patient informed. No questions or concerns at this time.

## 2014-06-13 ENCOUNTER — Encounter: Payer: Self-pay | Admitting: Obstetrics & Gynecology

## 2014-06-13 ENCOUNTER — Ambulatory Visit (INDEPENDENT_AMBULATORY_CARE_PROVIDER_SITE_OTHER): Payer: Self-pay | Admitting: Obstetrics & Gynecology

## 2014-06-13 VITALS — BP 153/78 | HR 87 | Temp 98.4°F | Ht 68.0 in | Wt 393.1 lb

## 2014-06-13 DIAGNOSIS — N912 Amenorrhea, unspecified: Secondary | ICD-10-CM

## 2014-06-13 MED ORDER — NORGESTIMATE-ETH ESTRADIOL 0.25-35 MG-MCG PO TABS: 1.0000 | ORAL_TABLET | Freq: Every day | ORAL | Status: AC

## 2014-06-13 NOTE — Patient Instructions (Addendum)
Exercise to Lose Weight Exercise and a healthy diet may help you lose weight. Your doctor may suggest specific exercises. EXERCISE IDEAS AND TIPS  Choose low-cost things you enjoy doing, such as walking, bicycling, or exercising to workout videos.  Take stairs instead of the elevator.  Walk during your lunch break.  Park your car further away from work or school.  Go to a gym or an exercise class.  Start with 5 to 10 minutes of exercise each day. Build up to 30 minutes of exercise 4 to 6 days a week.  Wear shoes with good support and comfortable clothes.  Stretch before and after working out.  Work out until you breathe harder and your heart beats faster.  Drink extra water when you exercise.  Do not do so much that you hurt yourself, feel dizzy, or get very short of breath. Exercises that burn about 150 calories:  Running 1  miles in 15 minutes.  Playing volleyball for 45 to 60 minutes.  Washing and waxing a car for 45 to 60 minutes.  Playing touch football for 45 minutes.  Walking 1  miles in 35 minutes.  Pushing a stroller 1  miles in 30 minutes.  Playing basketball for 30 minutes.  Raking leaves for 30 minutes.  Bicycling 5 miles in 30 minutes.  Walking 2 miles in 30 minutes.  Dancing for 30 minutes.  Shoveling snow for 15 minutes.  Swimming laps for 20 minutes.  Walking up stairs for 15 minutes.  Bicycling 4 miles in 15 minutes.  Gardening for 30 to 45 minutes.  Jumping rope for 15 minutes.  Washing windows or floors for 45 to 60 minutes. Document Released: 10/29/2010 Document Revised: 12/19/2011 Document Reviewed: 10/29/2010 [facility] Patient Information 2015 [place], [place]. This information is not intended to replace advice given to you by your health care provider. Make sure you discuss any questions you have with your health care provider. Oral Contraception Use Oral contraceptive pills (OCPs) are medicines taken to prevent pregnancy. OCPs  work by preventing the ovaries from releasing eggs. The hormones in OCPs also cause the cervical mucus to thicken, preventing the sperm from entering the uterus. The hormones also cause the uterine lining to become thin, not allowing a fertilized egg to attach to the inside of the uterus. OCPs are highly effective when taken exactly as prescribed. However, OCPs do not prevent sexually transmitted diseases (STDs). Safe sex practices, such as using condoms along with an OCP, can help prevent STDs. Before taking OCPs, you may have a physical exam and Pap test. Your health care provider may also order blood tests if necessary. Your health care provider will make sure you are a good candidate for oral contraception. Discuss with your health care provider the possible side effects of the OCP you may be prescribed. When starting an OCP, it can take 2 to 3 months for the body to adjust to the changes in hormone levels in your body.  HOW TO TAKE ORAL CONTRACEPTIVE PILLS Your health care provider may advise you on how to start taking the first cycle of OCPs. Otherwise, you can:   Start on day 1 of your menstrual period. You will not need any backup contraceptive protection with this start time.   Start on the first Sunday after your menstrual period or the day you get your prescription. In these cases, you will need to use backup contraceptive protection for the first week.   Start the pill at any time of your  cycle. If you take the pill within 5 days of the start of your period, you are protected against pregnancy right away. In this case, you will not need a backup form of birth control. If you start at any other time of your menstrual cycle, you will need to use another form of birth control for 7 days. If your OCP is the type called a minipill, it will protect you from pregnancy after taking it for 2 days (48 hours). After you have started taking OCPs:   If you forget to take 1 pill, take it as soon as you  remember. Take the next pill at the regular time.   If you miss 2 or more pills, call your health care provider because different pills have different instructions for missed doses. Use backup birth control until your next menstrual period starts.   If you use a 28-day pack that contains inactive pills and you miss 1 of the last 7 pills (pills with no hormones), it will not matter. Throw away the rest of the non-hormone pills and start a new pill pack.  No matter which day you start the OCP, you will always start a new pack on that same day of the week. Have an extra pack of OCPs and a backup contraceptive method available in case you miss some pills or lose your OCP pack.  HOME CARE INSTRUCTIONS   Do not smoke.   Always use a condom to protect against STDs. OCPs do not protect against STDs.   Use a calendar to mark your menstrual period days.   Read the information and directions that came with your OCP. Talk to your health care provider if you have questions.  SEEK MEDICAL CARE IF:   You develop nausea and vomiting.   You have abnormal vaginal discharge or bleeding.   You develop a rash.   You miss your menstrual period.   You are losing your hair.   You need treatment for mood swings or depression.   You get dizzy when taking the OCP.   You develop acne from taking the OCP.   You become pregnant.  SEEK IMMEDIATE MEDICAL CARE IF:   You develop chest pain.   You develop shortness of breath.   You have an uncontrolled or severe headache.   You develop numbness or slurred speech.   You develop visual problems.   You develop pain, redness, and swelling in the legs.  Document Released: 09/15/2011 Document Revised: 02/10/2014 Document Reviewed: 03/17/2013 [facility] Patient Information 2015 [place], [place]. This information is not intended to replace advice given to you by your health care provider. Make sure you discuss any questions you have with your  health care provider.

## 2014-06-13 NOTE — Progress Notes (Signed)
Subjective:     Patient ID: [name], female   DOB: 03/11/1986, 28 y.o.   MRN: [id number]  HPI Pt reports that even with the  of Provera she did not have a period.  She reports that she has gained >15# since her last visit.  She was exercising with her husband but, his schedule changed and he can't exercise with her anymore.  She reports that she is too self conscious to exercise alone.        Review of Systems     Objective:   Physical Exam BP 153/78  Pulse 87  Temp(Src) 98.4 F (36.9 C) (Oral)  Ht  (1.727 m)  Wt 393 lb 1.6 oz (178.309 kg)  BMI 59.78 kg/m2 Weight prev 173kg now 178kg.  Pt in NAD   04/25/2014 CLINICAL DATA: Amenorrhea  EXAM:  TRANSABDOMINAL AND TRANSVAGINAL ULTRASOUND OF PELVIS  TECHNIQUE:  Both transabdominal and transvaginal ultrasound examinations of the  pelvis were performed. Transabdominal technique was performed for  global imaging of the pelvis including uterus, ovaries, adnexal  regions, and pelvic cul-de-sac. It was necessary to proceed with  endovaginal exam following the transabdominal exam to visualize the  uterus, endometrium, ovaries, and adnexal regions.  COMPARISON: None  FINDINGS:  Uterus  Measurements: 6.2 x 3.7 x 4.6 cm. No fibroids or other mass  visualized.  Endometrium  Thickness: 2 mm. No focal abnormality visualized.  Right ovary  Measurements: 2.9 x 1.9 x 2.4 cm. Normal appearance/no adnexal mass.  Left ovary  Measurements: 2 x 1.4 x 1.2 cm. Normal appearance/no adnexal mass.  Other findings  Trace amount of physiologic fluid in the pelvis.  IMPRESSION:  Unremarkable pelvic ultrasound.      Assessment:     Amenorrhea Morbid obesity- referred to primary care for further eval       Plan:     Sprintec 1 po q day to attempt to induce menses Signed pt up for [facility] with free week consultation with her permission. She will go for consult tomorrow

## 2014-07-04 ENCOUNTER — Ambulatory Visit: Payer: PRIVATE HEALTH INSURANCE | Attending: Family Medicine | Admitting: Family Medicine

## 2014-07-04 ENCOUNTER — Encounter: Payer: Self-pay | Admitting: Family Medicine

## 2014-07-04 ENCOUNTER — Telehealth: Payer: Self-pay | Admitting: *Deleted

## 2014-07-04 DIAGNOSIS — D509 Iron deficiency anemia, unspecified: Secondary | ICD-10-CM

## 2014-07-04 DIAGNOSIS — Z6841 Body Mass Index (BMI) 40.0 and over, adult: Secondary | ICD-10-CM | POA: Insufficient documentation

## 2014-07-04 DIAGNOSIS — I10 Essential (primary) hypertension: Secondary | ICD-10-CM | POA: Insufficient documentation

## 2014-07-04 DIAGNOSIS — E282 Polycystic ovarian syndrome: Secondary | ICD-10-CM | POA: Insufficient documentation

## 2014-07-04 DIAGNOSIS — Z791 Long term (current) use of non-steroidal anti-inflammatories (NSAID): Secondary | ICD-10-CM | POA: Insufficient documentation

## 2014-07-04 DIAGNOSIS — N949 Unspecified condition associated with female genital organs and menstrual cycle: Secondary | ICD-10-CM

## 2014-07-04 DIAGNOSIS — N938 Other specified abnormal uterine and vaginal bleeding: Secondary | ICD-10-CM

## 2014-07-04 LAB — CBC
HCT: 33.7 % — ABNORMAL LOW (ref 36.0–46.0)
Hemoglobin: 10.5 g/dL — ABNORMAL LOW (ref 12.0–15.0)
MCH: 21.6 pg — ABNORMAL LOW (ref 26.0–34.0)
MCHC: 31.2 g/dL (ref 30.0–36.0)
MCV: 69.3 fL — ABNORMAL LOW (ref 78.0–100.0)
Platelets: 381 10*3/uL (ref 150–400)
RBC: 4.86 MIL/uL (ref 3.87–5.11)
RDW: 17.7 % — AB (ref 11.5–15.5)
WBC: 6 10*3/uL (ref 4.0–10.5)

## 2014-07-04 MED ORDER — METFORMIN HCL ER 500 MG PO TB24: 1000.0000 mg | ORAL_TABLET | Freq: Every day | ORAL | Status: AC

## 2014-07-04 MED ORDER — HYDROCHLOROTHIAZIDE 25 MG PO TABS: 25.0000 mg | ORAL_TABLET | Freq: Every day | ORAL | Status: AC

## 2014-07-04 NOTE — Progress Notes (Signed)
   Subjective:    Patient ID: [name], female    DOB: 08/22/1986, 28 y.o.   MRN: [id number] CC: establish care, HTN, morbid obesity/PCOS  HPI 28 year old female presents to establish care discussed the following:  #1 hypertension: Patient with elevated blood pressure during her last 2 office visits with her gynecologist. She denies chest pain. She admits to shortness of breath with minimal exertion. She's gained 50 pounds in the last 9 months. She consumes a poor diet high in prepackaged foods.  #2 morbid obesity/PCOS: patient and morbidly obese most of her life. Over the past 9 months she's gained 50 pounds. Her weight gain is distressing to her. Her weight gain is resulting in fatigue in shortness of breath with minimal exertion. She consumes a poor diet high in prepackaged foods and low in fresh vegetables. She is a secondary to establish he does not work. She eats about 5 miles a day. She often sleeps between meals. She just recently started walking in the evenings with her husband. She desires weight loss. She would be interested in learning more about weight loss surgery it was an option for her. Her thyroid has been checked multiple times and has been normal. Soc hx: chronic non smoker  Review of Systems  As per  HPI     Objective:   Physical Exam BP 137/92  Pulse 82  Temp(Src) 98.3 F (36.8 C) (Oral)  Resp 20  Ht  (1.727 m)  Wt 399 lb 14.4 oz (181.393 kg)  BMI 60.82 kg/m2  SpO2 98% General appearance: alert, cooperative, no distress and morbidly obese Neck: no adenopathy, supple, symmetrical, trachea midline and thyroid not enlarged, symmetric, no tenderness/mass/nodules Lungs: clear to auscultation bilaterally Heart: regular rate and rhythm, S1, S2 normal, no murmur, click, rub or gallop Extremities: edema trace b/l        Assessment & Plan:

## 2014-07-04 NOTE — Patient Instructions (Signed)
Mrs. Lempke,  Thank you for coming in today. It was a pleasure meeting you. I look forward to being your primary doctor.   1. For elevated BP, goal is < 140/90 at all times with BP as close to 120/80 as possible Start HCTZ 25 mg daily   2. For PCOS, obesity, rapid weight gain Metformin XR 500 mg with supper for first week, then increase to 1000 mg with supper if you tolerate this we will increase to 1000 mg twice daily  Diet recommendations: Low carb diet   Starchy (carb) foods include: Bread, rice, pasta, potatoes, corn, crackers, bagels, muffins, all baked goods.   Protein foods include: Meat, fish, poultry, eggs, dairy foods, and beans such as pinto and kidney beans (beans also provide carbohydrate).   1. Eat at least 3 meals and 1-2 snacks per day. Never go more than 4-5 hours while awake without eating.  2. Limit starchy foods to TWO per meal and ONE per snack. ONE portion of a starchy  food is equal to the following:   - ONE slice of bread (or its equivalent, such as half of a hamburger bun).   - 1/2 cup of a "scoopable" starchy food such as potatoes or rice.   - 1 OUNCE (28 grams) of starchy snack foods such as crackers or pretzels (look on label).   - 15 grams of carbohydrate as shown on food label.  3. Both lunch and dinner should include a protein food, a carb food, and vegetables.   - Obtain twice as many veg's as protein or carbohydrate foods for both lunch and dinner.   - Try to keep frozen veg's on hand for a quick vegetable serving.     - Fresh or frozen veg's are best.  4. Breakfast should always include protein.    Apply for [place] discount  You will be called with lab results  F/u with me in 4 weeks for BP check and f/u weight  Dr. Armen Pickup

## 2014-07-04 NOTE — Progress Notes (Signed)
Establish Care Complaining of wt gain. Gain 139 Lb in the last 3 month

## 2014-07-04 NOTE — Assessment & Plan Note (Signed)
  1. For elevated BP, goal is < 140/90 at all times with BP as close to 120/80 as possible Start HCTZ 25 mg daily

## 2014-07-04 NOTE — Telephone Encounter (Addendum)
Pt left message stating that Dr. Erin Fulling ahd started her on birth control pills and she is having intense headaches every Gladyce Mcray. She has not been able to get rid of them at all.  Please call back.  9/28  1540  Called pt and discussed her concern. She stated that she had visit with Dr. Armen Pickup @ [facility] to initiate PCP care due to high blood pressure and increased weight gain on 9/25 after leaving the message on our nurse voice mail. She was started on HCTZ and Metformin which she began taking that Yuktha Kerchner. Since that time, she has had only 1 headache. I encouraged pt to continue medications as prescribed. If she develops headaches again, she should consult with Dr. Armen Pickup or representative at [facility].  Pt voiced understanding.

## 2014-07-04 NOTE — Assessment & Plan Note (Addendum)
  2. For PCOS, obesity, rapid weight gain Metformin XR 500 mg with supper for first week, then increase to 1000 mg with supper if you tolerate this we will increase to 1000 mg twice daily Low carb diet Regular exercise Plan  for referral to dietitian. Plan for further surgery and discuss surgical weight loss options.  Patient will need to apply for [place] discount

## 2014-07-05 LAB — TSH: TSH: 2.253 u[IU]/mL (ref 0.350–4.500)

## 2014-07-08 DIAGNOSIS — D509 Iron deficiency anemia, unspecified: Secondary | ICD-10-CM | POA: Insufficient documentation

## 2014-07-08 MED ORDER — FERROUS SULFATE 325 (65 FE) MG PO TABS: 325.0000 mg | ORAL_TABLET | Freq: Two times a day (BID) | ORAL | Status: AC

## 2014-07-08 NOTE — Addendum Note (Signed)
Addended by: Dessa PhiFUNCHES, Pristine [place] on: 07/08/2014 02:26 PM   Modules accepted: Orders

## 2014-07-09 ENCOUNTER — Telehealth: Payer: Self-pay | Admitting: *Deleted

## 2014-07-09 NOTE — Telephone Encounter (Signed)
Pt aware of lab results 

## 2014-07-09 NOTE — Telephone Encounter (Signed)
Message copied by Dyann KiefGIRALDEZ, Eddi Hymes M on Wed Jul 09, 2014  5:42 PM ------      Message from: Dessa PhiFUNCHES, JOSALYN      Created: Tue Jul 08, 2014  2:24 PM       Normal TSH      Anemia, start oral iron. ------

## 2014-07-30 ENCOUNTER — Ambulatory Visit: Payer: PRIVATE HEALTH INSURANCE

## 2014-08-01 ENCOUNTER — Ambulatory Visit: Payer: PRIVATE HEALTH INSURANCE | Admitting: Family Medicine

## 2014-08-05 ENCOUNTER — Telehealth: Payer: Self-pay | Admitting: *Deleted

## 2014-08-05 NOTE — Telephone Encounter (Signed)
[name] called and left a message that she is on a birth control pill and she knows that Dr. Erin FullingHarraway-Smith told her she would be getting a menstrual cycle by her second pack. But states she is on her second pack , getting ready to start her 3rd pack and still hasn't seen anything. Wants to know if she needs to make appointment to be seen by Dr. Burnice LoganHarrawayKatrinka Blazing- Smith again since the pills are helping or what she needs to do to get a menstrual cycle. Per chart has PCOS.

## 2014-08-06 NOTE — Telephone Encounter (Signed)
Returned call to pt and heard message stating that the number dialed is incorrect. Per chart review, pt DNKA w[facility]upFunches [facility] for f/u of BP & weight on 10/23.  Pt also DNKA for financial counseling on 10/21.  Pt should take 1 more month of birth control pills, then if no period return to clinic for fu care. If pt has appt w/Dr. Armen PickupFunches, she may also be able to address the amenorrhea and plan of care.

## 2014-08-11 ENCOUNTER — Encounter: Payer: Self-pay | Admitting: Family Medicine

## 2014-08-11 NOTE — Telephone Encounter (Signed)
Called [name]'s phone numbers and unable to leave a message- heard a message the number you have dialed is incorrect. Called work number and heard female voice on message stating name not company name.

## 2014-08-13 NOTE — Telephone Encounter (Signed)
Patient not returning call, will await her call back if she still needs us.

## 2014-09-15 ENCOUNTER — Telehealth: Payer: Self-pay | Admitting: General Practice

## 2014-09-15 DIAGNOSIS — N920 Excessive and frequent menstruation with regular cycle: Secondary | ICD-10-CM

## 2014-09-15 NOTE — Telephone Encounter (Signed)
Patient called and left message stating she is on sprintec and she started bleeding towards the end of her white pills and recently started her new pack and she is still bleeding and would like to know if we can prescribe something to stop the bleeding.

## 2014-09-16 MED ORDER — MEGESTROL ACETATE 40 MG PO TABS: 40.0000 mg | ORAL_TABLET | Freq: Every day | ORAL | Status: AC

## 2014-09-16 NOTE — Telephone Encounter (Signed)
-----   Message from Willodean Rosenthalarolyn Harraway-Smith, MD sent at 09/16/2014  1:41 PM EST ----- Please see prev note from [place] to me.  This pt was started on OCP's due to amenorrhea.  It looks like she is now having daily bleeding for 2 weeks????  If so, please assess the AMOUNT of bleeding. If it is a small amount please ask her to continue the OCP's. If it is a lot she can have Megace 40mg  q day for 10 days.  Thx, clh-S

## 2014-09-16 NOTE — Telephone Encounter (Signed)
Patient returned call and states she has been bleeding heavily for the past 2.5 weeks needing to change a tampon 6+ times per day. Informed patient Megace 40mg  daily may be prescribed to be taken once daily for 10 days to help stop bleeding. Per Dr. Erin FullingHarraway-Smith, patient should stop OCPs while on Megace and restart after megace is completed. Patient informed. [facility] [address] Patient verbalized understanding. No questions or concerns.

## 2014-09-16 NOTE — Telephone Encounter (Signed)
Attempted to contact patient. NO answer. Left message to call clinics.

## 2014-09-16 NOTE — Telephone Encounter (Signed)
Called patient back and she states she just started her 4th pack of pills and she has been bleeding for two weeks straight now and it has only got heavier and would like to switch OCPs. Told patient I will let her doctor know and we will call her back when we hear back. Patient verbalized understanding and had no other questions

## 2014-12-08 ENCOUNTER — Other Ambulatory Visit: Payer: Self-pay | Admitting: Internal Medicine
